# Patient Record
Sex: Female | Born: 1990 | Hispanic: Yes | Marital: Married | Smoking: Never smoker
Health system: Southern US, Community
[De-identification: ages and names within clinical notes are randomized; demographics above are authoritative.]

## PROBLEM LIST (undated history)

## (undated) ENCOUNTER — Inpatient Hospital Stay (HOSPITAL_COMMUNITY): Payer: Self-pay

## (undated) DIAGNOSIS — H9319 Tinnitus, unspecified ear: Secondary | ICD-10-CM

## (undated) DIAGNOSIS — J189 Pneumonia, unspecified organism: Secondary | ICD-10-CM

## (undated) DIAGNOSIS — E0590 Thyrotoxicosis, unspecified without thyrotoxic crisis or storm: Secondary | ICD-10-CM

## (undated) DIAGNOSIS — J45909 Unspecified asthma, uncomplicated: Secondary | ICD-10-CM

## (undated) DIAGNOSIS — E559 Vitamin D deficiency, unspecified: Secondary | ICD-10-CM

## (undated) DIAGNOSIS — R42 Dizziness and giddiness: Secondary | ICD-10-CM

## (undated) DIAGNOSIS — R011 Cardiac murmur, unspecified: Secondary | ICD-10-CM

## (undated) DIAGNOSIS — Z9889 Other specified postprocedural states: Secondary | ICD-10-CM

## (undated) DIAGNOSIS — R519 Headache, unspecified: Secondary | ICD-10-CM

## (undated) DIAGNOSIS — Z87440 Personal history of urinary (tract) infections: Secondary | ICD-10-CM

## (undated) DIAGNOSIS — R51 Headache: Secondary | ICD-10-CM

## (undated) DIAGNOSIS — E039 Hypothyroidism, unspecified: Secondary | ICD-10-CM

## (undated) DIAGNOSIS — R112 Nausea with vomiting, unspecified: Secondary | ICD-10-CM

## (undated) DIAGNOSIS — T7840XA Allergy, unspecified, initial encounter: Secondary | ICD-10-CM

## (undated) HISTORY — PX: HERNIA REPAIR: SHX51

## (undated) HISTORY — DX: Unspecified asthma, uncomplicated: J45.909

## (undated) HISTORY — DX: Dizziness and giddiness: R42

## (undated) HISTORY — DX: Allergy, unspecified, initial encounter: T78.40XA

## (undated) HISTORY — DX: Vitamin D deficiency, unspecified: E55.9

## (undated) HISTORY — DX: Headache: R51

## (undated) HISTORY — DX: Cardiac murmur, unspecified: R01.1

## (undated) HISTORY — DX: Headache, unspecified: R51.9

## (undated) HISTORY — DX: Tinnitus, unspecified ear: H93.19

---
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
## 2012-06-18 ENCOUNTER — Ambulatory Visit (INDEPENDENT_AMBULATORY_CARE_PROVIDER_SITE_OTHER): Payer: PRIVATE HEALTH INSURANCE | Admitting: Emergency Medicine

## 2012-06-18 VITALS — BP 98/70 | HR 76 | Temp 98.6°F | Resp 16 | Ht 65.0 in | Wt 114.6 lb

## 2012-06-18 DIAGNOSIS — N912 Amenorrhea, unspecified: Secondary | ICD-10-CM

## 2012-06-18 DIAGNOSIS — R42 Dizziness and giddiness: Secondary | ICD-10-CM

## 2012-06-18 DIAGNOSIS — R51 Headache: Secondary | ICD-10-CM

## 2012-06-18 LAB — POCT CBC
Granulocyte percent: 50.6 %G (ref 37–80)
HCT, POC: 44.2 % (ref 37.7–47.9)
Hemoglobin: 13.9 g/dL (ref 12.2–16.2)
Lymph, poc: 3.2 (ref 0.6–3.4)
MCH, POC: 29.8 pg (ref 27–31.2)
MCHC: 31.4 g/dL — AB (ref 31.8–35.4)
MCV: 94.7 fL (ref 80–97)
MID (cbc): 0.5 (ref 0–0.9)
MPV: 9.7 fL (ref 0–99.8)
POC Granulocyte: 3.7 (ref 2–6.9)
POC LYMPH PERCENT: 43.2 %L (ref 10–50)
POC MID %: 6.2 %M (ref 0–12)
Platelet Count, POC: 275 10*3/uL (ref 142–424)
RBC: 4.67 M/uL (ref 4.04–5.48)
RDW, POC: 13.9 %
WBC: 7.4 10*3/uL (ref 4.6–10.2)

## 2012-06-18 LAB — POCT INFLUENZA A/B
Influenza A, POC: NEGATIVE
Influenza B, POC: NEGATIVE

## 2012-06-18 LAB — POCT URINE PREGNANCY: Preg Test, Ur: NEGATIVE

## 2012-06-18 NOTE — Progress Notes (Signed)
  Subjective:    Patient ID: Ronnald Nian, female    DOB: 07-02-1991, 21 y.o.   MRN: [id number]  HPI patient enters with a chief complaint of for the last 3-4 days she has had a headache and some dizziness. She has a funny sensation in her ears. She has felt a little faint at times . She denies any true vertigo. She's had a headache but says it has not been severe. She works at [employer] and has been concerned number for coworkers have had the flu. She would like to be tested for the fluid to be sure she's not contagious at work    Review of Systems     Objective:   Physical Exam  Constitutional: She is oriented to person, place, and time. She appears well-developed and well-nourished.  HENT:  Head: Normocephalic.  Right Ear: External ear normal.  Left Ear: External ear normal.  Eyes: Pupils are equal, round, and reactive to light.  Neck: Normal range of motion. Neck supple.  Cardiovascular: Normal rate and regular rhythm.        There is a 2/6 systolic murmur present at the left sternal border.  Pulmonary/Chest: Effort normal and breath sounds normal. No respiratory distress. She has no wheezes. She has no rales.  Abdominal: She exhibits no distension. There is no tenderness. There is no rebound.  Musculoskeletal: Normal range of motion.  Neurological: She is alert and oriented to person, place, and time. She has normal reflexes. No cranial nerve deficit. She exhibits normal muscle tone. Coordination normal.      Results for orders placed in visit on 06/18/12  POCT CBC      Component Value Range   WBC 7.4  4.6 - 10.2 K/uL   Lymph, poc 3.2  0.6 - 3.4   POC LYMPH PERCENT 43.2  10 - 50 %L   MID (cbc) 0.5  0 - 0.9   POC MID % 6.2  0 - 12 %M   POC Granulocyte 3.7  2 - 6.9   Granulocyte percent 50.6  37 - 80 %G   RBC 4.67  4.04 - 5.48 M/uL   Hemoglobin 13.9  12.2 - 16.2 g/dL   HCT, POC 40.9  81.1 - 47.9 %   MCV 94.7  80 - 97 fL   MCH, POC 29.8  27 - 31.2 pg   MCHC 31.4 (*)  31.8 - 35.4 g/dL   RDW, POC 91.4     Platelet Count, POC 275  142 - 424 K/uL   MPV 9.7  0 - 99.8 fL  POCT INFLUENZA A/B      Component Value Range   Influenza A, POC Negative     Influenza B, POC Negative    POCT URINE PREGNANCY      Component Value Range   Preg Test, Ur Negative        Assessment & Plan:  I advised the patient it would be a good idea to have an echocardiogram at some time. She would prefer to have this done at [facility] and let me know what cardiologist she wants to see. I encouraged her to take Tylenol and drink large quantities of fluids and if she continues to have problems or she has worsening we will proceed with a head CT .

## 2012-06-18 NOTE — Progress Notes (Deleted)
  Subjective:    Patient ID: Janet Allen, female    DOB: [birth year], 21 y.o.   MRN: [id number]  HPI    Review of Systems     Objective:   Physical Exam        Assessment & Plan:

## 2012-06-19 LAB — COMPREHENSIVE METABOLIC PANEL
ALT: 10 U/L (ref 0–35)
AST: 15 U/L (ref 0–37)
Albumin: 5.3 g/dL — ABNORMAL HIGH (ref 3.5–5.2)
Alkaline Phosphatase: 73 U/L (ref 39–117)
BUN: 7 mg/dL (ref 6–23)
CO2: 26 mEq/L (ref 19–32)
Calcium: 10.2 mg/dL (ref 8.4–10.5)
Chloride: 104 mEq/L (ref 96–112)
Creat: 0.79 mg/dL (ref 0.50–1.10)
Glucose, Bld: 118 mg/dL — ABNORMAL HIGH (ref 70–99)
Potassium: 4.4 mEq/L (ref 3.5–5.3)
Sodium: 139 mEq/L (ref 135–145)
Total Bilirubin: 0.4 mg/dL (ref 0.3–1.2)
Total Protein: 8.4 g/dL — ABNORMAL HIGH (ref 6.0–8.3)

## 2012-06-19 LAB — TSH: TSH: 1.467 u[IU]/mL (ref 0.350–4.500)

## 2012-11-10 ENCOUNTER — Other Ambulatory Visit: Payer: Self-pay | Admitting: Family Medicine

## 2012-11-10 DIAGNOSIS — D249 Benign neoplasm of unspecified breast: Secondary | ICD-10-CM

## 2012-11-17 ENCOUNTER — Ambulatory Visit
Admission: RE | Admit: 2012-11-17 | Discharge: 2012-11-17 | Disposition: A | Discharge status: Home / Self Care | Payer: PRIVATE HEALTH INSURANCE | Source: Ambulatory Visit | Attending: Family Medicine | Admitting: Family Medicine

## 2012-11-17 DIAGNOSIS — D249 Benign neoplasm of unspecified breast: Secondary | ICD-10-CM

## 2012-11-30 ENCOUNTER — Ambulatory Visit (INDEPENDENT_AMBULATORY_CARE_PROVIDER_SITE_OTHER): Payer: PRIVATE HEALTH INSURANCE | Admitting: General Surgery

## 2012-12-01 ENCOUNTER — Ambulatory Visit (INDEPENDENT_AMBULATORY_CARE_PROVIDER_SITE_OTHER): Payer: PRIVATE HEALTH INSURANCE | Admitting: General Surgery

## 2012-12-01 ENCOUNTER — Encounter (INDEPENDENT_AMBULATORY_CARE_PROVIDER_SITE_OTHER): Payer: Self-pay | Admitting: General Surgery

## 2012-12-01 VITALS — BP 118/62 | HR 84 | Temp 98.4°F | Resp 18 | Ht 64.0 in | Wt <= 1120 oz

## 2012-12-01 DIAGNOSIS — N6320 Unspecified lump in the left breast, unspecified quadrant: Secondary | ICD-10-CM

## 2012-12-01 DIAGNOSIS — N63 Unspecified lump in unspecified breast: Secondary | ICD-10-CM

## 2012-12-01 NOTE — Progress Notes (Signed)
Patient ID: Janet Allen, female   DOB: 1991-04-23, 22 y.o.   MRN: [id number]  Chief Complaint  Patient presents with  . New Evaluation    breast    HPI Janet Allen is a 22 y.o. female.  HPI  This is a 22 year old female who palpated an abnormal spot in her breast within the last month. Her breast was extremely painful right before her period.  She saw her primary care physician who ordered a ultrasound of the breast. 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 The pain has subsided a little bit now that her period is over. She has never had masses in her breasts before. She did have a grandmother that had multiple benign breast problems.  Past medical history: Asthma  Past Surgical History  Procedure Date  . Hernia repair 1990  . Tympanostomy tube placement 1991  . Wisdom tooth extraction 2004    Family History  Problem Relation Age of Onset  . Hypertension Mother   . Depression Mother   . Anxiety disorder Sister   . Fibrocystic breast disease Maternal Grandmother   . Hypertension Paternal Grandmother   . Cancer Paternal Grandmother     stomach    Social History History  Substance Use Topics  . Smoking status: Never Smoker   . Smokeless tobacco: Not on file  . Alcohol Use: Not on file    No Known Allergies  Current Outpatient Prescriptions  Medication Sig Dispense Refill  . fluticasone-salmeterol (ADVAIR HFA) 115-21 MCG/ACT inhaler Inhale 2 puffs into the lungs 2 (two) times daily.      Marland Kitchen levocetirizine (XYZAL) 5 MG tablet Take 5 mg by mouth every evening.        Review of Systems Review of Systems  HENT: Positive for sore throat.   Respiratory: Positive for cough.     Blood pressure 118/62, pulse 84, temperature 98.4 F (36.9 C), resp. rate 18, height 5\' 4"  (1.626 m), weight 11 lb (4.99 kg).  Physical Exam Physical Exam  Constitutional: She is oriented to person, place, and time. She appears well-developed and  well-nourished. No distress.  HENT:  Head: Normocephalic and atraumatic.  Right Ear: External ear normal.  Eyes: Conjunctivae normal are normal. Pupils are equal, round, and reactive to light. Right eye exhibits no discharge. Left eye exhibits no discharge. No scleral icterus.  Neck: Normal range of motion. Neck supple. No tracheal deviation present. No thyromegaly present.  Cardiovascular: Normal rate and intact distal pulses.   Pulmonary/Chest: Effort normal. No respiratory distress. Left breast exhibits mass (x2).         Left breast mass 6 o'clock was around 2 cm, one at 2 o'clock 1.5 cm.    Abdominal: Soft. She exhibits no distension and no mass. There is no tenderness. There is no rebound and no guarding.  Musculoskeletal: Normal range of motion. She exhibits no tenderness.  Lymphadenopathy:    She has no cervical adenopathy.  Neurological: She is alert and oriented to person, place, and time. Coordination normal.  Skin: Skin is warm and dry. No rash noted. She is not diaphoretic. No erythema. No pallor.  Psychiatric: She has a normal mood and affect. Her behavior is normal. Judgment and thought content normal.    Data Reviewed Ultrasound removed  Assessment/Plan    Mass of multiple sites of left breast Patient is a 22 year old female with 2 masses in the left breast. One of them is at 2:00 and one  is at 6:00. 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 Because of the symptoms and need for pathologic diagnosis, we will plan excision.  The surgery was reviewed with the patient. I advised her that she would need to do no heavy lifting for around 4-7 days.  I reviewed the risk of surgery including bleeding, infection, damage to adjacent structures. I reviewed the risk of seroma. Educational materials given to the patient. She is a Engineer, civil (consulting) at [employer].  She is going to call surgery scheduling when she is looking at her schedule and can arrange time to have adequate recovery  period.     Wm Fruchter 12/01/2012, 9:38 AM

## 2012-12-01 NOTE — Patient Instructions (Signed)
IF YOU ARE TAKING ASPIRIN, COUMADIN/WARFARIN, PLAVIX, OR OTHER BLOOD THINNER, PLEASE LET us KNOW IMMEDIATELY.  WE WILL NEED TO DISCUSS WITH THE PRESCRIBING PROVIDER IF THESE ARE SAFE TO STOP. IF THESE ARE NOT STOPPED AT THE APPROPRIATE TIME, THIS WILL RESULT IN A DELAY FOR YOUR SURGERY.  DO NOT TAKE THESE MEDICATIONS OR IBUPROFEN/NAPROXEN WITHIN A WEEK BEFORE SURGERY.   The main risks of surgery are bleeding, infection, damage to other structures, and seroma (accumulation of fluid) under the incision site(s).    These complications may lead to additional procedures such as drainage of seroma/infection.  Most women do accumulate fluid in the breast cavity where the specimen was removed. We do not always have to drain this fluid.  If your breast is very tense, painful, or red, then we may need to numb the skin and use a needle to aspirate the fluid.  We do provide patients with a Breast Binder.  The purpose of this is to avoid the use of tape on the sensitive tissue of the breast and to provide some compression to minimize the risk of seroma.  If the binder is uncomfortable, you may find that a tank top with a built-in shelf bra or a loose sports bra works better for you.  I recommend wearing this around the clock for the first 1-2 weeks except in the shower.    You may remove your dressings and may shower 48 hours after surgery .  Many patients have some constipation in the week after surgery due to the narcotics and anesthesia.  You may need over the counter stool softeners or laxatives if you experience difficulty having bowel movements.    If the following occur, call our office at [phone]: If you have a fever over 101 or pain that is severe despite narcotics. If you have redness or drainage at the wound. If you develop persistent nausea or vomiting.  I will follow you back up in 1-4 weeks.    Please submit any paperwork about time off work/insurance forms to the front  desk.

## 2012-12-01 NOTE — Assessment & Plan Note (Signed)
Patient is a 23 year old female with 2 masses in the left breast. One of them is at 2:00 and one is at 6:00. 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 Because of the symptoms and need for pathologic diagnosis, we will plan excision.  The surgery was reviewed with the patient. I advised her that she would need to do no heavy lifting for around 4-7 days.  I reviewed the risk of surgery including bleeding, infection, damage to adjacent structures. I reviewed the risk of seroma. Educational materials given to the patient. 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

## 2012-12-10 ENCOUNTER — Ambulatory Visit (INDEPENDENT_AMBULATORY_CARE_PROVIDER_SITE_OTHER): Payer: PRIVATE HEALTH INSURANCE | Admitting: Internal Medicine

## 2012-12-10 VITALS — BP 134/86 | HR 112 | Temp 98.4°F | Resp 16 | Ht 65.0 in | Wt 108.0 lb

## 2012-12-10 DIAGNOSIS — J029 Acute pharyngitis, unspecified: Secondary | ICD-10-CM

## 2012-12-10 DIAGNOSIS — J309 Allergic rhinitis, unspecified: Secondary | ICD-10-CM

## 2012-12-10 DIAGNOSIS — R05 Cough: Secondary | ICD-10-CM

## 2012-12-10 DIAGNOSIS — R5383 Other fatigue: Secondary | ICD-10-CM

## 2012-12-10 DIAGNOSIS — J45909 Unspecified asthma, uncomplicated: Secondary | ICD-10-CM | POA: Insufficient documentation

## 2012-12-10 LAB — POCT CBC
Lymph, poc: 2.1 (ref 0.6–3.4)
MCH, POC: 30.2 pg (ref 27–31.2)
MCHC: 32.2 g/dL (ref 31.8–35.4)
MCV: 93.7 fL (ref 80–97)
MID (cbc): 0.7 (ref 0–0.9)
POC LYMPH PERCENT: 23.1 %L (ref 10–50)
Platelet Count, POC: 317 10*3/uL (ref 142–424)
RBC: 4.87 M/uL (ref 4.04–5.48)
WBC: 9.3 10*3/uL (ref 4.6–10.2)

## 2012-12-10 MED ORDER — LIDOCAINE VISCOUS 2 % MT SOLN: OROMUCOSAL | Status: DC

## 2012-12-11 NOTE — Progress Notes (Signed)
  Subjective:    Patient ID: Janet Allen, female    DOB: Jun 04, 1991, 22 y.o.   MRN: [id number]  HPI has been ill for the last 3 weeks Started with a flulike illness/this led to cough with wheezing She was treated with Zithromax prednisone and nebulization by her allergist(im Steroids as well)- This followed a visit at [facility] where diagnosis was viral Has been seen again posttreatment day 5 without improvement Now a day and since Zithromax in complaining of worsening of sore throat for 3 days, fatigue, dysphagia, coughing at night nonproductive.  History of extrinsic asthma and allergic rhinitis Recent workup for tender breast mass thought to be fibroadenoma by ultrasound   Employed as nurse at [employer]    Review of Systems No fever chills night sweats No weight loss No chest pain or palpitations No heat or cold intolerance No recent appetite change except for poor intake for the last 2 weeks No episodic genitourinary problems No change in menses    Objective   Physical Exam Vital signs within normal limits No acute distress TMs clear Nares clear Throat injected with vesicles on the posterior pharynx No a.c. p.c. or [place] nodes Chest clear Heart regular without murmur Abdomen benign Extremities clear No rash  Results for orders placed in visit on 12/10/12  POCT CBC      Result Value Range   WBC 9.3  4.6 - 10.2 K/uL   Lymph, poc 2.1  0.6 - 3.4   POC LYMPH PERCENT 23.1  10 - 50 %L   MID (cbc) 0.7  0 - 0.9   POC MID % 7.9  0 - 12 %M   POC Granulocyte 6.4  2 - 6.9   Granulocyte percent 69.0  37 - 80 %G   RBC 4.87  4.04 - 5.48 M/uL   Hemoglobin 14.7  12.2 - 16.2 g/dL   HCT, POC 08.6  57.8 - 47.9 %   MCV 93.7  80 - 97 fL   MCH, POC 30.2  27 - 31.2 pg   MCHC 32.2  31.8 - 35.4 g/dL   RDW, POC 46.9     Platelet Count, POC 317  142 - 424 K/uL   MPV 9.7  0 - 99.8 fL  POCT RAPID STREP A (OFFICE)      Result Value Range   Rapid Strep A Screen  Negative  Negative        Assessment & Plan:  Problem #1 viral pharyngitis following an initial viral infection that developed into reactive airway disease. 1. Other malaise and fatigue  POCT CBC   POCT CBC   TSH  2. Cough    3. Acute pharyngitis  POCT rapid strep A   POCT rapid strep A   Culture, Group A Strep  4. Extrinsic asthma, unspecified    5. Allergic rhinitis       Treat Symptoms and expect recovery within 1 week or recheck Tc sent

## 2012-12-13 LAB — CULTURE, GROUP A STREP: Organism ID, Bacteria: NORMAL

## 2012-12-16 ENCOUNTER — Other Ambulatory Visit: Payer: Self-pay

## 2013-03-12 ENCOUNTER — Encounter (INDEPENDENT_AMBULATORY_CARE_PROVIDER_SITE_OTHER): Payer: Self-pay | Admitting: General Surgery

## 2013-03-12 ENCOUNTER — Ambulatory Visit (INDEPENDENT_AMBULATORY_CARE_PROVIDER_SITE_OTHER): Payer: PRIVATE HEALTH INSURANCE | Admitting: General Surgery

## 2013-03-12 ENCOUNTER — Encounter (HOSPITAL_COMMUNITY): Payer: Self-pay | Admitting: Pharmacy Technician

## 2013-03-12 VITALS — BP 132/82 | HR 78 | Temp 97.4°F | Resp 16 | Ht 64.0 in | Wt 112.9 lb

## 2013-03-12 DIAGNOSIS — N6320 Unspecified lump in the left breast, unspecified quadrant: Secondary | ICD-10-CM

## 2013-03-12 DIAGNOSIS — N63 Unspecified lump in unspecified breast: Secondary | ICD-10-CM

## 2013-03-12 NOTE — Assessment & Plan Note (Signed)
Patient was unable previously to get surgery scheduled due to job constraints.  I will to excise both of these masses under general anesthesia. I advised the patient that I would make 2 incisions in order to minimize the trauma to her skin and underlying breast.  She is advised of the risk of bleeding and infection.  This is an outpatient procedure. She was advised that she will need someone with her at home overnight.  I also discussed lifting restrictions with her.

## 2013-03-12 NOTE — Progress Notes (Signed)
HISTORY: Patient is a 22-year-old female with 2 breast masses in the left breast. 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 She was unable to schedule surgery in January because of her job.  She feels that the mass it at 2:00 has gotten much more painful and larger.  The breast pain during her periods is especially bad.  She is ready now to have them excised.     PERTINENT REVIEW OF SYSTEMS: otherwise negative.    Filed Vitals:   03/12/13 1414  BP: 132/82  Pulse: 78  Temp: 97.4 F (36.3 C)  Resp: 16    EXAM: Head: Normocephalic and atraumatic.  Eyes:  Conjunctivae are normal. Pupils are equal, round, and reactive to light. No scleral icterus.  Breast:  Left upper outer quadrant mass now around 3 cm, 6 o'clock mass 1.5x1 cm Resp: No respiratory distress, normal effort. Neurological: Alert and oriented to person, place, and time. Coordination normal.  Skin: Skin is warm and dry. No rash noted. No diaphoretic. No erythema. No pallor.  Psychiatric: Normal mood and affect. Normal behavior. Judgment and thought content normal.      ASSESSMENT AND PLAN:   Mass of multiple sites of left breast Patient was unable previously to get surgery scheduled due to job constraints.  I will to excise both of these masses under general anesthesia. I advised the patient that I would make 2 incisions in order to minimize the trauma to her skin and underlying breast.  She is advised of the risk of bleeding and infection.  This is an outpatient procedure. She was advised that she will need someone with her at home overnight.  I also discussed lifting restrictions with her.      Iana Buzan L Lynnetta Tom, MD Surgical Oncology, General & Endocrine Surgery [facility]  WOLTERS,SHARON A, MD Wolters, Sharon A, MD   

## 2013-03-12 NOTE — Patient Instructions (Signed)
IF YOU ARE TAKING ASPIRIN, COUMADIN/WARFARIN, PLAVIX, OR OTHER BLOOD THINNER, PLEASE LET US KNOW IMMEDIATELY.  WE WILL NEED TO DISCUSS WITH THE PRESCRIBING PROVIDER IF THESE ARE SAFE TO STOP. IF THESE ARE NOT STOPPED AT THE APPROPRIATE TIME, THIS WILL RESULT IN A DELAY FOR YOUR SURGERY.  DO NOT TAKE THESE MEDICATIONS OR IBUPROFEN/NAPROXEN WITHIN A WEEK BEFORE SURGERY.   The main risks of surgery are bleeding, infection, damage to other structures, and seroma (accumulation of fluid) under the incision site(s).    These complications may lead to additional procedures such as drainage of seroma/infection.  If cancer is found, you may need other surgeries to obtain negative margins or to take more lymph nodes.   Most women do accumulate fluid in the breast cavity where the specimen was removed. We do not always have to drain this fluid.  If your breast is very tense, painful, or red, then we may need to numb the skin and use a needle to aspirate the fluid.  We do provide patients with a Breast Binder.  The purpose of this is to avoid the use of tape on the sensitive tissue of the breast and to provide some compression to minimize the risk of seroma.  If the binder is uncomfortable, you may find that a tank top with a built-in shelf bra or a loose sports bra works better for you.  I recommend wearing this around the clock for the first 1-2 weeks except in the shower.    You may remove your dressings and may shower 48 hours after surgery IF YOU DO NOT HAVE A DRAIN.    Many patients have some constipation in the week after surgery due to the narcotics and anesthesia.  You may need over the counter stool softeners or laxatives if you experience difficulty having bowel movements.    If the following occur, call our office at [phone]: If you have a fever over 101 or pain that is severe despite narcotics. If you have redness or drainage at the wound. If you develop persistent nausea or vomiting.  I  will follow you back up in 1-4 weeks.    Please submit any paperwork about time off work/insurance forms to the front desk.   

## 2013-03-14 ENCOUNTER — Encounter (HOSPITAL_COMMUNITY): Payer: Self-pay

## 2013-03-14 ENCOUNTER — Encounter (HOSPITAL_COMMUNITY)
Admission: RE | Admit: 2013-03-14 | Discharge: 2013-03-14 | Disposition: A | Discharge status: Home / Self Care | Payer: PRIVATE HEALTH INSURANCE | Source: Ambulatory Visit | Attending: General Surgery | Admitting: General Surgery

## 2013-03-14 HISTORY — DX: Personal history of urinary (tract) infections: Z87.440

## 2013-03-14 HISTORY — DX: Pneumonia, unspecified organism: J18.9

## 2013-03-14 LAB — CBC
MCH: 30.7 pg (ref 26.0–34.0)
Platelets: 226 10*3/uL (ref 150–400)
RBC: 4.17 MIL/uL (ref 3.87–5.11)
RDW: 12.9 % (ref 11.5–15.5)
WBC: 4.6 10*3/uL (ref 4.0–10.5)

## 2013-03-14 LAB — SURGICAL PCR SCREEN: MRSA, PCR: NEGATIVE

## 2013-03-14 MED ORDER — CEFAZOLIN SODIUM-DEXTROSE 2-3 GM-% IV SOLR
2.0000 g | INTRAVENOUS | Status: AC
  Administered 2013-03-15: 2 g via INTRAVENOUS
  Filled 2013-03-14: qty 50

## 2013-03-14 NOTE — Pre-Procedure Instructions (Addendum)
ELYSIA GRAND  03/14/2013   Your procedure is scheduled on:  03/15/13  Report to [facility] at 1230 AM.  Call this number if you have problems the morning of surgery: [phone]   Remember:   Do not eat food or drink liquids after midnight.   Take these medicines the morning of surgery with A SIP OF WATER: inhalers   Do not wear jewelry, make-up or nail polish.  Do not wear lotions, powders, or perfumes. You may wear deodorant.  Do not shave 48 hours prior to surgery. Men may shave face and neck.  Do not bring valuables to the hospital.  Contacts, dentures or bridgework may not be worn into surgery.  Leave suitcase in the car. After surgery it may be brought to your room.  For patients admitted to the hospital, checkout time is 11:00 AM the day of  discharge.   Patients discharged the day of surgery will not be allowed to drive  home.  Name and phone number of your driver:   Special Instructions: Shower using CHG 2 nights before surgery and the night before surgery.  If you shower the day of surgery use CHG.  Use special wash - you have one bottle of CHG for all showers.  You should use approximately 1/3 of the bottle for each shower.   Please read over the following fact sheets that you were given: Pain Booklet, Coughing and Deep Breathing, MRSA Information and Surgical Site Infection Prevention

## 2013-03-15 ENCOUNTER — Encounter (HOSPITAL_COMMUNITY): Payer: Self-pay | Admitting: *Deleted

## 2013-03-15 ENCOUNTER — Encounter (HOSPITAL_COMMUNITY)
Admission: RE | Disposition: A | Discharge status: Home / Self Care | Payer: Self-pay | Source: Ambulatory Visit | Attending: General Surgery

## 2013-03-15 ENCOUNTER — Encounter (HOSPITAL_COMMUNITY): Payer: Self-pay | Admitting: Anesthesiology

## 2013-03-15 ENCOUNTER — Ambulatory Visit (HOSPITAL_COMMUNITY): Payer: PRIVATE HEALTH INSURANCE | Admitting: Anesthesiology

## 2013-03-15 ENCOUNTER — Ambulatory Visit (HOSPITAL_COMMUNITY)
Admission: RE | Admit: 2013-03-15 | Discharge: 2013-03-15 | Disposition: A | Discharge status: Home / Self Care | Payer: PRIVATE HEALTH INSURANCE | Source: Ambulatory Visit | Attending: General Surgery | Admitting: General Surgery

## 2013-03-15 DIAGNOSIS — Z8701 Personal history of pneumonia (recurrent): Secondary | ICD-10-CM | POA: Insufficient documentation

## 2013-03-15 DIAGNOSIS — N63 Unspecified lump in unspecified breast: Secondary | ICD-10-CM | POA: Insufficient documentation

## 2013-03-15 DIAGNOSIS — J45909 Unspecified asthma, uncomplicated: Secondary | ICD-10-CM | POA: Insufficient documentation

## 2013-03-15 DIAGNOSIS — R011 Cardiac murmur, unspecified: Secondary | ICD-10-CM | POA: Insufficient documentation

## 2013-03-15 DIAGNOSIS — D249 Benign neoplasm of unspecified breast: Secondary | ICD-10-CM

## 2013-03-15 HISTORY — PX: MASS EXCISION: SHX2000

## 2013-03-15 SURGERY — EXCISION MASS
Anesthesia: General | Site: Breast | Laterality: Left | Wound class: Clean

## 2013-03-15 MED ORDER — LIDOCAINE HCL (PF) 1 % IJ SOLN
INTRAMUSCULAR | Status: AC
  Filled 2013-03-15: qty 30

## 2013-03-15 MED ORDER — OXYCODONE HCL 5 MG PO TABS
5.0000 mg | ORAL_TABLET | ORAL | Status: DC | PRN
  Administered 2013-03-15: 5 mg via ORAL

## 2013-03-15 MED ORDER — LACTATED RINGERS IV SOLN
INTRAVENOUS | Status: DC
  Administered 2013-03-15: 14:00:00 via INTRAVENOUS

## 2013-03-15 MED ORDER — OXYCODONE HCL 5 MG PO TABS
ORAL_TABLET | ORAL | Status: AC
  Filled 2013-03-15: qty 1

## 2013-03-15 MED ORDER — 0.9 % SODIUM CHLORIDE (POUR BTL) OPTIME
TOPICAL | Status: DC | PRN
  Administered 2013-03-15: 1000 mL

## 2013-03-15 MED ORDER — LIDOCAINE HCL (CARDIAC) 20 MG/ML IV SOLN
INTRAVENOUS | Status: DC | PRN
  Administered 2013-03-15: 50 mg via INTRAVENOUS

## 2013-03-15 MED ORDER — BUPIVACAINE-EPINEPHRINE PF 0.25-1:200000 % IJ SOLN: INTRAMUSCULAR | Status: DC | PRN

## 2013-03-15 MED ORDER — MIDAZOLAM HCL 5 MG/5ML IJ SOLN
INTRAMUSCULAR | Status: DC | PRN
  Administered 2013-03-15: 2 mg via INTRAVENOUS

## 2013-03-15 MED ORDER — PROPOFOL 10 MG/ML IV BOLUS
INTRAVENOUS | Status: DC | PRN
  Administered 2013-03-15: 200 mg via INTRAVENOUS

## 2013-03-15 MED ORDER — LACTATED RINGERS IV SOLN
INTRAVENOUS | Status: DC | PRN
  Administered 2013-03-15 (×2): via INTRAVENOUS

## 2013-03-15 MED ORDER — KETOROLAC TROMETHAMINE 30 MG/ML IJ SOLN
INTRAMUSCULAR | Status: DC | PRN
  Administered 2013-03-15: 30 mg via INTRAVENOUS

## 2013-03-15 MED ORDER — BUPIVACAINE HCL (PF) 0.25 % IJ SOLN
INTRAMUSCULAR | Status: AC
  Filled 2013-03-15: qty 10

## 2013-03-15 MED ORDER — ONDANSETRON HCL 4 MG/2ML IJ SOLN
INTRAMUSCULAR | Status: DC | PRN
  Administered 2013-03-15: 4 mg via INTRAVENOUS

## 2013-03-15 MED ORDER — BUPIVACAINE-EPINEPHRINE PF 0.25-1:200000 % IJ SOLN
INTRAMUSCULAR | Status: AC
  Filled 2013-03-15: qty 30

## 2013-03-15 MED ORDER — BUPIVACAINE-EPINEPHRINE PF 0.25-1:200000 % IJ SOLN
INTRAMUSCULAR | Status: DC | PRN
  Administered 2013-03-15: 15:00:00

## 2013-03-15 MED ORDER — FENTANYL CITRATE 0.05 MG/ML IJ SOLN
INTRAMUSCULAR | Status: DC | PRN
  Administered 2013-03-15 (×3): 25 ug via INTRAVENOUS
  Administered 2013-03-15: 50 ug via INTRAVENOUS

## 2013-03-15 MED ORDER — OXYCODONE-ACETAMINOPHEN 5-325 MG PO TABS: 1.0000 | ORAL_TABLET | ORAL | Status: DC | PRN

## 2013-03-15 SURGICAL SUPPLY — 55 items
ADH SKN CLS APL DERMABOND .7 (GAUZE/BANDAGES/DRESSINGS)
APL SKNCLS STERI-STRIP NONHPOA (GAUZE/BANDAGES/DRESSINGS) ×1
BENZOIN TINCTURE PRP APPL 2/3 (GAUZE/BANDAGES/DRESSINGS) ×2 IMPLANT
BINDER BREAST MEDIUM (GAUZE/BANDAGES/DRESSINGS) ×1 IMPLANT
BLADE SURG 10 STRL SS (BLADE) ×2 IMPLANT
BLADE SURG 15 STRL LF DISP TIS (BLADE) ×1 IMPLANT
BLADE SURG 15 STRL SS (BLADE) ×2
CANISTER SUCTION 2500CC (MISCELLANEOUS) ×2 IMPLANT
CHLORAPREP W/TINT 26ML (MISCELLANEOUS) ×2 IMPLANT
CLOTH BEACON ORANGE TIMEOUT ST (SAFETY) ×2 IMPLANT
COVER SURGICAL LIGHT HANDLE (MISCELLANEOUS) ×2 IMPLANT
DERMABOND ADVANCED (GAUZE/BANDAGES/DRESSINGS)
DERMABOND ADVANCED .7 DNX12 (GAUZE/BANDAGES/DRESSINGS) IMPLANT
DRAPE LAPAROSCOPIC ABDOMINAL (DRAPES) IMPLANT
DRAPE PED LAPAROTOMY (DRAPES) IMPLANT
DRAPE UTILITY 15X26 W/TAPE STR (DRAPE) ×4 IMPLANT
DRSG PAD ABDOMINAL 8X10 ST (GAUZE/BANDAGES/DRESSINGS) ×1 IMPLANT
DRSG TEGADERM 4X4.75 (GAUZE/BANDAGES/DRESSINGS) IMPLANT
ELECT CAUTERY BLADE 6.4 (BLADE) ×2 IMPLANT
ELECT REM PT RETURN 9FT ADLT (ELECTROSURGICAL) ×2
ELECTRODE REM PT RTRN 9FT ADLT (ELECTROSURGICAL) ×1 IMPLANT
GAUZE SPONGE 4X4 16PLY XRAY LF (GAUZE/BANDAGES/DRESSINGS) ×2 IMPLANT
GLOVE BIO SURGEON STRL SZ 6 (GLOVE) ×2 IMPLANT
GLOVE BIOGEL PI IND STRL 6.5 (GLOVE) ×1 IMPLANT
GLOVE BIOGEL PI IND STRL 7.0 (GLOVE) IMPLANT
GLOVE BIOGEL PI INDICATOR 6.5 (GLOVE) ×1
GLOVE BIOGEL PI INDICATOR 7.0 (GLOVE) ×1
GLOVE ECLIPSE 8.0 STRL XLNG CF (GLOVE) ×1 IMPLANT
GLOVE SURG SS PI 7.0 STRL IVOR (GLOVE) ×1 IMPLANT
GOWN PREVENTION PLUS XXLARGE (GOWN DISPOSABLE) ×2 IMPLANT
GOWN STRL NON-REIN LRG LVL3 (GOWN DISPOSABLE) ×2 IMPLANT
KIT BASIN OR (CUSTOM PROCEDURE TRAY) ×2 IMPLANT
KIT ROOM TURNOVER OR (KITS) ×2 IMPLANT
NDL HYPO 25GX1X1/2 BEV (NEEDLE) ×1 IMPLANT
NEEDLE HYPO 25GX1X1/2 BEV (NEEDLE) ×2 IMPLANT
NS IRRIG 1000ML POUR BTL (IV SOLUTION) ×2 IMPLANT
PACK SURGICAL SETUP 50X90 (CUSTOM PROCEDURE TRAY) ×2 IMPLANT
PAD ARMBOARD 7.5X6 YLW CONV (MISCELLANEOUS) ×4 IMPLANT
PEN SKIN MARKING BROAD (MISCELLANEOUS) ×1 IMPLANT
PENCIL BUTTON HOLSTER BLD 10FT (ELECTRODE) ×2 IMPLANT
SPECIMEN JAR SMALL (MISCELLANEOUS) ×2 IMPLANT
SPONGE GAUZE 4X4 12PLY (GAUZE/BANDAGES/DRESSINGS) ×1 IMPLANT
STRIP CLOSURE SKIN 1/2X4 (GAUZE/BANDAGES/DRESSINGS) ×1 IMPLANT
SUT MON AB 4-0 PC3 18 (SUTURE) ×3 IMPLANT
SUT SILK 2 0 FS (SUTURE) IMPLANT
SUT VIC AB 3-0 SH 27 (SUTURE) ×4
SUT VIC AB 3-0 SH 27X BRD (SUTURE) ×1 IMPLANT
SYR BULB 3OZ (MISCELLANEOUS) ×2 IMPLANT
SYR CONTROL 10ML LL (SYRINGE) ×2 IMPLANT
TAPE CLOTH SURG 4X10 WHT LF (GAUZE/BANDAGES/DRESSINGS) ×1 IMPLANT
TOWEL OR 17X24 6PK STRL BLUE (TOWEL DISPOSABLE) ×2 IMPLANT
TOWEL OR 17X26 10 PK STRL BLUE (TOWEL DISPOSABLE) ×2 IMPLANT
TUBE CONNECTING 12X1/4 (SUCTIONS) IMPLANT
WATER STERILE IRR 1000ML POUR (IV SOLUTION) IMPLANT
YANKAUER SUCT BULB TIP NO VENT (SUCTIONS) IMPLANT

## 2013-03-15 NOTE — Anesthesia Procedure Notes (Signed)
Procedure Name: LMA Insertion Date/Time: 03/15/2013 1:58 PM Performed by: Elizbeth Squires R Pre-anesthesia Checklist: Patient identified, Emergency Drugs available, Suction available and Patient being monitored Patient Re-evaluated:Patient Re-evaluated prior to inductionOxygen Delivery Method: Circle system utilized Preoxygenation: Pre-oxygenation with 100% oxygen Intubation Type: IV induction LMA: LMA inserted LMA Size: 3.0 Number of attempts: 1 Placement Confirmation: positive ETCO2 and breath sounds checked- equal and bilateral Tube secured with: Tape Dental Injury: Teeth and Oropharynx as per pre-operative assessment

## 2013-03-15 NOTE — Interval H&P Note (Signed)
History and Physical Interval Note:  03/15/2013 1:25 PM  Janet Allen  has presented today for surgery, with the diagnosis of breast mass  The various methods of treatment have been discussed with the patient and family. After consideration of risks, benefits and other options for treatment, the patient has consented to  Procedure(s) with comments: EXCISION OF TWO LEFT BREAST MASS (Left) - ANESTHESIA: CHOICE OR GENERAL as a surgical intervention .  The patient's history has been reviewed, patient examined, no change in status, stable for surgery.  I have reviewed the patient's chart and labs.  Questions were answered to the patient's satisfaction.     Kebin Maye

## 2013-03-15 NOTE — Op Note (Signed)
Excisional Breast Biopsy  Indications: This patient presents with history of left breast mass x2, probable fibroadenoma, enlarging and symptomatic  Pre-operative Diagnosis: left breast mass x2  Post-operative Diagnosis: left breast mass x2  Surgeon: Almond Lint   Anesthesia: General LMA anesthesia and Local anesthesia 1% plain lidocaine, 0.25.% bupivacaine, with epinephrine  ASA Class: 2  Procedure Details  The patient was seen in the Holding Room. The risks, benefits, complications, treatment options, and expected outcomes were discussed with the patient. The possibilities of reaction to medication, pulmonary aspiration, bleeding, infection, the need for additional procedures, failure to diagnose a condition, and creating a complication requiring transfusion or operation were discussed with the patient. The patient concurred with the proposed plan, giving informed consent.  The site of surgery properly noted/marked. The patient was taken to Operating Room # 1, identified, and the procedure verified as Breast Excisional Biopsy. A Time Out was held and the above information confirmed.  After induction of anesthesia, the left  breast and chest were prepped and draped in standard fashion. The lumpectomy was performed by creating a transverse incision over the upper outer quadrant of the breast.  Dissection was carried down to the pectoralis fascia while the mass was elevated with an allis clamp. This mass was less discrete with denser breast tissue at the borders.   Orientation sutures were placed.  Hemostasis was achieved with cautery.  The wound was irrigated and closed with a 3-0 Vicryl interrupted deep dermal stitch and a 4-0 Monocryl subcuticular closure in layers.  The second lumpectomy was performed by creating a vertical incision over the lower midline portion of the breast.  Dissection was carried down to the pectoralis fascia while the mass was elevated with an Allis clamp.  Orientation  sutures were placed.  Hemostasis was achieved with cautery.  The wound was irrigated and closed with a 3-0 Vicryl interrupted deep dermal stitch and a 4-0 Monocryl subcuticular closure in layers.     Sterile dressings were applied. At the end of the operation, all sponge, instrument, and needle counts were correct.  Findings: grossly clear surgical margins  Estimated Blood Loss:  Minimal                Specimens: Left breast mass 2 o'clock, Left breast mass 6 o'clock         Complications:  None; patient tolerated the procedure well.         Disposition: PACU - hemodynamically stable.         Condition: stable

## 2013-03-15 NOTE — Anesthesia Preprocedure Evaluation (Addendum)
Anesthesia Evaluation  Patient identified by MRN, date of birth, ID band Patient awake    Reviewed: Allergy & Precautions, H&P , NPO status , Patient's Chart, lab work & pertinent test results  Airway Mallampati: I TM Distance: >3 FB Neck ROM: Full    Dental  (+) Dental Advisory Given and Teeth Intact   Pulmonary asthma , pneumonia -, resolved,  breath sounds clear to auscultation        Cardiovascular + Valvular Problems/Murmurs Rhythm:Regular Rate:Normal     Neuro/Psych    GI/Hepatic   Endo/Other    Renal/GU      Musculoskeletal   Abdominal   Peds  Hematology   Anesthesia Other Findings   Reproductive/Obstetrics                          Anesthesia Physical Anesthesia Plan  ASA: II  Anesthesia Plan: General   Post-op Pain Management:    Induction: Intravenous  Airway Management Planned: LMA  Additional Equipment:   Intra-op Plan:   Post-operative Plan: Extubation in OR  Informed Consent: I have reviewed the patients History and Physical, chart, labs and discussed the procedure including the risks, benefits and alternatives for the proposed anesthesia with the patient or authorized representative who has indicated his/her understanding and acceptance.     Plan Discussed with: CRNA and Surgeon  Anesthesia Plan Comments:         Anesthesia Quick Evaluation

## 2013-03-15 NOTE — H&P (View-Only) (Signed)
HISTORY: Patient is a 22 year old female with 2 breast masses in the left breast. 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 She was unable to schedule surgery in January because of her job.  She feels that the mass it at 2:00 has gotten much more painful and larger.  The breast pain during her periods is especially bad.  She is ready now to have them excised.     PERTINENT REVIEW OF SYSTEMS: otherwise negative.    Filed Vitals:   03/12/13 1414  BP: 132/82  Pulse: 78  Temp: 97.4 F (36.3 C)  Resp: 16    EXAM: Head: Normocephalic and atraumatic.  Eyes:  Conjunctivae are normal. Pupils are equal, round, and reactive to light. No scleral icterus.  Breast:  Left upper outer quadrant mass now around 3 cm, 6 o'clock mass 1.5x1 cm Resp: No respiratory distress, normal effort. Neurological: Alert and oriented to person, place, and time. Coordination normal.  Skin: Skin is warm and dry. No rash noted. No diaphoretic. No erythema. No pallor.  Psychiatric: Normal mood and affect. Normal behavior. Judgment and thought content normal.      ASSESSMENT AND PLAN:   Mass of multiple sites of left breast Patient was unable previously to get surgery scheduled due to job constraints.  I will to excise both of these masses under general anesthesia. I advised the patient that I would make 2 incisions in order to minimize the trauma to her skin and underlying breast.  She is advised of the risk of bleeding and infection.  This is an outpatient procedure. She was advised that she will need someone with her at home overnight.  I also discussed lifting restrictions with her.      Maudry Diego, MD Surgical Oncology, General & Endocrine Surgery [facility]  Emeterio Reeve, MD Emeterio Reeve, MD

## 2013-03-15 NOTE — Transfer of Care (Signed)
Immediate Anesthesia Transfer of Care Note  Patient: Janet Allen  Procedure(s) Performed: Procedure(s): EXCISION OF TWO LEFT BREAST MASSES (Left)  Patient Location: PACU  Anesthesia Type:General  Level of Consciousness: responds to stimulation  Airway & Oxygen Therapy: Patient Spontanous Breathing and Patient connected to nasal cannula oxygen  Post-op Assessment: Report given to PACU RN and Post -op Vital signs reviewed and stable  Post vital signs: Reviewed and stable  Complications: No apparent anesthesia complications

## 2013-03-15 NOTE — Anesthesia Postprocedure Evaluation (Signed)
  Anesthesia Post-op Note  Patient: Janet Allen  Procedure(s) Performed: Procedure(s): EXCISION OF TWO LEFT BREAST MASSES (Left)  Patient Location: PACU  Anesthesia Type:General  Level of Consciousness: awake and alert   Airway and Oxygen Therapy: Patient Spontanous Breathing  Post-op Pain: mild  Post-op Assessment: Post-op Vital signs reviewed, Patient's Cardiovascular Status Stable, Respiratory Function Stable, Patent Airway, No signs of Nausea or vomiting and Pain level controlled  Post-op Vital Signs: stable  Complications: No apparent anesthesia complications

## 2013-03-16 ENCOUNTER — Encounter (HOSPITAL_COMMUNITY): Payer: Self-pay | Admitting: General Surgery

## 2013-03-19 ENCOUNTER — Telehealth (INDEPENDENT_AMBULATORY_CARE_PROVIDER_SITE_OTHER): Payer: Self-pay

## 2013-03-19 NOTE — Telephone Encounter (Signed)
Pt notified pathology benign.

## 2013-03-19 NOTE — Progress Notes (Signed)
Quick Note:  Please let pt know that path is benign. ______

## 2013-03-29 ENCOUNTER — Encounter (INDEPENDENT_AMBULATORY_CARE_PROVIDER_SITE_OTHER): Payer: PRIVATE HEALTH INSURANCE | Admitting: General Surgery

## 2013-04-02 ENCOUNTER — Ambulatory Visit (INDEPENDENT_AMBULATORY_CARE_PROVIDER_SITE_OTHER): Payer: PRIVATE HEALTH INSURANCE | Admitting: General Surgery

## 2013-04-02 ENCOUNTER — Encounter (INDEPENDENT_AMBULATORY_CARE_PROVIDER_SITE_OTHER): Payer: Self-pay | Admitting: General Surgery

## 2013-04-02 VITALS — BP 118/72 | HR 81 | Temp 98.8°F | Resp 17 | Ht 64.0 in | Wt 112.0 lb

## 2013-04-02 DIAGNOSIS — N63 Unspecified lump in unspecified breast: Secondary | ICD-10-CM

## 2013-04-02 DIAGNOSIS — N6320 Unspecified lump in the left breast, unspecified quadrant: Secondary | ICD-10-CM

## 2013-04-02 NOTE — Progress Notes (Signed)
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  She is not taking narcotics, but she is still having a fair amount of soreness in her left breast.  She is wearing a sports bra all the time.  She denies fevers/ chills.  She denies redness of the breast.  She is still taking some ibuprofen and/or tylenol.      EXAM: General:  Alert and oriented Incision:  Well healed.  No erythema or drainage.  Small seroma/hematoma at lateral biopsy site.     PATHOLOGY: Fibroadenoma.     ASSESSMENT AND PLAN:   Mass of multiple sites of left breast No evidence of surgical complications.  Follow up as needed.  Advised pt to minimize caffeine.   Call if breast soreness does not resolve.        Maudry Diego, MD Surgical Oncology, General & Endocrine Surgery [facility]  Emeterio Reeve, MD Emeterio Reeve, MD

## 2013-04-02 NOTE — Assessment & Plan Note (Signed)
No evidence of surgical complications.  Follow up as needed.  Advised pt to minimize caffeine.   Call if breast soreness does not resolve.

## 2013-06-05 ENCOUNTER — Other Ambulatory Visit: Payer: Self-pay | Admitting: Family Medicine

## 2013-06-05 DIAGNOSIS — N6314 Unspecified lump in the right breast, lower inner quadrant: Secondary | ICD-10-CM

## 2013-06-05 DIAGNOSIS — N6320 Unspecified lump in the left breast, unspecified quadrant: Secondary | ICD-10-CM

## 2013-06-11 ENCOUNTER — Ambulatory Visit
Admission: RE | Admit: 2013-06-11 | Discharge: 2013-06-11 | Disposition: A | Discharge status: Home / Self Care | Payer: PRIVATE HEALTH INSURANCE | Source: Ambulatory Visit | Attending: Family Medicine | Admitting: Family Medicine

## 2013-06-11 DIAGNOSIS — N6314 Unspecified lump in the right breast, lower inner quadrant: Secondary | ICD-10-CM

## 2013-06-11 DIAGNOSIS — N6320 Unspecified lump in the left breast, unspecified quadrant: Secondary | ICD-10-CM

## 2013-07-27 ENCOUNTER — Encounter (INDEPENDENT_AMBULATORY_CARE_PROVIDER_SITE_OTHER): Payer: Self-pay | Admitting: General Surgery

## 2013-07-27 ENCOUNTER — Ambulatory Visit (INDEPENDENT_AMBULATORY_CARE_PROVIDER_SITE_OTHER): Payer: PRIVATE HEALTH INSURANCE | Admitting: General Surgery

## 2013-07-27 VITALS — BP 106/62 | HR 66 | Temp 97.6°F | Resp 14 | Ht 64.0 in | Wt 109.4 lb

## 2013-07-27 DIAGNOSIS — N63 Unspecified lump in unspecified breast: Secondary | ICD-10-CM

## 2013-07-27 DIAGNOSIS — N6320 Unspecified lump in the left breast, unspecified quadrant: Secondary | ICD-10-CM

## 2013-07-27 NOTE — Patient Instructions (Signed)
IF YOU ARE TAKING ASPIRIN, COUMADIN/WARFARIN, PLAVIX, OR OTHER BLOOD THINNER, PLEASE LET US KNOW IMMEDIATELY.  WE WILL NEED TO DISCUSS WITH THE PRESCRIBING PROVIDER IF THESE ARE SAFE TO STOP. IF THESE ARE NOT STOPPED AT THE APPROPRIATE TIME, THIS WILL RESULT IN A DELAY FOR YOUR SURGERY.  DO NOT TAKE THESE MEDICATIONS OR IBUPROFEN/NAPROXEN WITHIN A WEEK BEFORE SURGERY.   The main risks of surgery are bleeding, infection, damage to other structures, and seroma (accumulation of fluid) under the incision site(s).    These complications may lead to additional procedures such as drainage of seroma/infection.  If cancer is found, you may need other surgeries to obtain negative margins or to take more lymph nodes.   Most women do accumulate fluid in the breast cavity where the specimen was removed. We do not always have to drain this fluid.  If your breast is very tense, painful, or red, then we may need to numb the skin and use a needle to aspirate the fluid.  We do provide patients with a Breast Binder.  The purpose of this is to avoid the use of tape on the sensitive tissue of the breast and to provide some compression to minimize the risk of seroma.  If the binder is uncomfortable, you may find that a tank top with a built-in shelf bra or a loose sports bra works better for you.  I recommend wearing this around the clock for the first 1-2 weeks except in the shower.    You may remove your dressings and may shower 48 hours after surgery IF YOU DO NOT HAVE A DRAIN.    Many patients have some constipation in the week after surgery due to the narcotics and anesthesia.  You may need over the counter stool softeners or laxatives if you experience difficulty having bowel movements.    If the following occur, call our office at [phone]: If you have a fever over 101 or pain that is severe despite narcotics. If you have redness or drainage at the wound. If you develop persistent nausea or vomiting.  I  will follow you back up in 1-4 weeks.    Please submit any paperwork about time off work/insurance forms to the front desk.   

## 2013-07-29 NOTE — Progress Notes (Signed)
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  She presented to her PCP with a new right sided mass.  She underwent ultrasound which demonstrates aright sided one, and la left sided one.  Her left breast masses were very large before she got them addressed, and she would like to get these taken care of while they are small.  These are slightly sore, but not very painful like the last ones were in the left breast.     PERTINENT REVIEW OF SYSTEMS: Otherwise negative x 11.  Getting married next may  Filed Vitals:   07/27/13 1147  BP: 106/62  Pulse: 66  Temp: 97.6 F (36.4 C)  Resp: 14   Filed Weights   07/27/13 1147  Weight: 109 lb 6.4 oz (49.624 kg)    EXAM: Head: Normocephalic and atraumatic.  Eyes:  Conjunctivae are normal. Pupils are equal, round, and reactive to light. No scleral icterus.  Neck:  Normal range of motion. Neck supple. No tracheal deviation present. No thyromegaly present.  Resp: No respiratory distress, normal effort. Breast:  The right breast has a palpable mobile mass at 10 o'clock, approx 1 cm in greatest dimension.  There are no masses palpable on the left breast.  The prior lumpectomy sites are sl tender.  No lymphadenopathy. Abd:  Abdomen is soft, non distended and non tender. No masses are palpable.  There is no rebound and no guarding.  Neurological: Alert and oriented to person, place, and time. Coordination normal.  Skin: Skin is warm and dry. No rash noted. No diaphoretic. No erythema. No pallor.  Psychiatric: Normal mood and affect. Normal behavior. Judgment and thought content normal.   Ultrasound Findings: Ultrasound is performed, showing there is a well  circumscribed, hypoechoic mass in the left breast at 3 o'clock in  the subareolar location measuring 6 x 4 x 8 mm. There is a well-  circumscribed, hypoechoic mass in the right breast at 10 o'clock 7  cm from the nipple measuring 1.0 x 0.6 x 1.2 cm. No mass or   abnormal shadowing is seen in the inferior aspect of the right  breast.    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   Given how large the last two got, she would like to get these two removed before they get large.   The left one is non palpable.  We will get this one needle localized prior to removal.    I will plan right excisional breast biopsy and left needle localized excisional biopsy.  I advised the patient that she would have similar restrictions to last time.  She will have approximately 1 week of lifting restrictions.  No driving on narcotics.  We reviewed risks of bleeding, pain, infection.  She will schedule around her work schedule.         Maudry Diego, MD Surgical Oncology, General & Endocrine Surgery [facility]  Emeterio Reeve, MD No ref. provider found

## 2013-07-29 NOTE — Assessment & Plan Note (Signed)
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   Given how large the last two got, she would like to get these two removed before they get large.   The left one is non palpable.  We will get this one needle localized prior to removal.    I will plan right excisional breast biopsy and left needle localized excisional biopsy.  I advised the patient that she would have similar restrictions to last time.  She will have approximately 1 week of lifting restrictions.  No driving on narcotics.  We reviewed risks of bleeding, pain, infection.  She will schedule around her work schedule.

## 2013-09-06 ENCOUNTER — Other Ambulatory Visit: Payer: Self-pay

## 2013-09-10 ENCOUNTER — Encounter (HOSPITAL_COMMUNITY): Payer: Self-pay | Admitting: Pharmacy Technician

## 2013-09-11 ENCOUNTER — Encounter (HOSPITAL_COMMUNITY): Payer: Self-pay

## 2013-09-11 ENCOUNTER — Encounter (HOSPITAL_COMMUNITY)
Admission: RE | Admit: 2013-09-11 | Discharge: 2013-09-11 | Disposition: A | Discharge status: Home / Self Care | Payer: PRIVATE HEALTH INSURANCE | Source: Ambulatory Visit | Attending: Anesthesiology | Admitting: Anesthesiology

## 2013-09-11 ENCOUNTER — Encounter (HOSPITAL_COMMUNITY)
Admission: RE | Admit: 2013-09-11 | Discharge: 2013-09-11 | Disposition: A | Discharge status: Home / Self Care | Payer: PRIVATE HEALTH INSURANCE | Source: Ambulatory Visit | Attending: General Surgery | Admitting: General Surgery

## 2013-09-11 DIAGNOSIS — Z01818 Encounter for other preprocedural examination: Secondary | ICD-10-CM | POA: Insufficient documentation

## 2013-09-11 DIAGNOSIS — Z01812 Encounter for preprocedural laboratory examination: Secondary | ICD-10-CM | POA: Insufficient documentation

## 2013-09-11 LAB — CBC
HCT: 39.8 % (ref 36.0–46.0)
Hemoglobin: 13.7 g/dL (ref 12.0–15.0)
MCHC: 34.4 g/dL (ref 30.0–36.0)
MCV: 90.5 fL (ref 78.0–100.0)
RBC: 4.4 MIL/uL (ref 3.87–5.11)
WBC: 5.5 10*3/uL (ref 4.0–10.5)

## 2013-09-11 LAB — BASIC METABOLIC PANEL
BUN: 9 mg/dL (ref 6–23)
CO2: 26 mEq/L (ref 19–32)
Chloride: 101 mEq/L (ref 96–112)
GFR calc Af Amer: >90 mL/min (ref >90)
GFR calc non Af Amer: >90 mL/min (ref >90)
Potassium: 3.7 mEq/L (ref 3.5–5.1)
Sodium: 138 mEq/L (ref 135–145)

## 2013-09-11 LAB — HCG, SERUM, QUALITATIVE: Preg, Serum: NEGATIVE

## 2013-09-11 NOTE — Pre-Procedure Instructions (Signed)
VALENE VILLA  09/11/2013   Your procedure is scheduled on:  Thursday November 20 th at 0930 AM  Report to [facility] Main Entrance "A" at 0730 AM.  Call this number if you have problems the morning of surgery: [phone]   Remember:   Do not eat food or drink liquids after midnight.   Take these medicines the morning of surgery with A SIP OF WATER: Use and Bring inhalers morning of surgery if needed   Stop Aspirin, Nsaids( Naproxen,Aleve, Ibuprofen, Advil), herbal medications and Vitamins 5 days prior to surgery.  Do not wear jewelry, make-up or nail polish.  Do not wear lotions, powders, or perfumes. You may wear deodorant.  Do not shave 48 hours prior to surgery. Men may shave face and neck.  Do not bring valuables to the hospital.  [facility] is not responsible for any belongings or valuables.               Contacts, dentures or bridgework may not be worn into surgery.  Leave suitcase in the car. After surgery it may be brought to your room.  For patients admitted to the hospital, discharge time is determined by your  treatment team.               Patients discharged the day of surgery will not be allowed to drive home.  Name and phone number of your driver:   Special Instructions: Shower using CHG 2 nights before surgery and the night before surgery.  If you shower the day of surgery use CHG.  Use special wash - you have one bottle of CHG for all showers.  You should use approximately 1/3 of the bottle for each shower.   Please read over the following fact sheets that you were given: Pain Booklet, Coughing and Deep Breathing, MRSA Information and Surgical Site Infection Prevention

## 2013-09-19 MED ORDER — CEFAZOLIN SODIUM-DEXTROSE 2-3 GM-% IV SOLR
2.0000 g | INTRAVENOUS | Status: AC
  Administered 2013-09-20: 2 g via INTRAVENOUS
  Filled 2013-09-19: qty 50

## 2013-09-20 ENCOUNTER — Encounter (HOSPITAL_COMMUNITY): Payer: PRIVATE HEALTH INSURANCE | Admitting: Anesthesiology

## 2013-09-20 ENCOUNTER — Other Ambulatory Visit (INDEPENDENT_AMBULATORY_CARE_PROVIDER_SITE_OTHER): Payer: Self-pay | Admitting: General Surgery

## 2013-09-20 ENCOUNTER — Ambulatory Visit (HOSPITAL_COMMUNITY)
Admission: RE | Admit: 2013-09-20 | Discharge: 2013-09-20 | Disposition: A | Discharge status: Home / Self Care | Payer: PRIVATE HEALTH INSURANCE | Source: Ambulatory Visit | Attending: General Surgery | Admitting: General Surgery

## 2013-09-20 ENCOUNTER — Ambulatory Visit (HOSPITAL_COMMUNITY): Payer: PRIVATE HEALTH INSURANCE | Admitting: Anesthesiology

## 2013-09-20 ENCOUNTER — Encounter (HOSPITAL_COMMUNITY): Payer: Self-pay | Admitting: Anesthesiology

## 2013-09-20 ENCOUNTER — Ambulatory Visit
Admission: RE | Admit: 2013-09-20 | Discharge: 2013-09-20 | Disposition: A | Discharge status: Home / Self Care | Payer: PRIVATE HEALTH INSURANCE | Source: Ambulatory Visit | Attending: General Surgery | Admitting: General Surgery

## 2013-09-20 ENCOUNTER — Ambulatory Visit
Admission: RE | Admit: 2013-09-20 | Discharge: 2013-09-20 | Disposition: A | Discharge status: Home / Self Care | Payer: No Typology Code available for payment source | Source: Ambulatory Visit | Attending: General Surgery | Admitting: General Surgery

## 2013-09-20 ENCOUNTER — Encounter (HOSPITAL_COMMUNITY)
Admission: RE | Disposition: A | Discharge status: Home / Self Care | Payer: Self-pay | Source: Ambulatory Visit | Attending: General Surgery

## 2013-09-20 DIAGNOSIS — N6320 Unspecified lump in the left breast, unspecified quadrant: Secondary | ICD-10-CM

## 2013-09-20 DIAGNOSIS — D249 Benign neoplasm of unspecified breast: Secondary | ICD-10-CM | POA: Insufficient documentation

## 2013-09-20 DIAGNOSIS — N6089 Other benign mammary dysplasias of unspecified breast: Secondary | ICD-10-CM | POA: Insufficient documentation

## 2013-09-20 DIAGNOSIS — N6019 Diffuse cystic mastopathy of unspecified breast: Secondary | ICD-10-CM

## 2013-09-20 HISTORY — PX: BREAST BIOPSY: SHX20

## 2013-09-20 SURGERY — BREAST BIOPSY WITH NEEDLE LOCALIZATION
Anesthesia: General | Site: Breast | Laterality: Right | Wound class: Clean

## 2013-09-20 MED ORDER — PHENYLEPHRINE HCL 10 MG/ML IJ SOLN
INTRAMUSCULAR | Status: DC | PRN
  Administered 2013-09-20: 20 ug via INTRAVENOUS
  Administered 2013-09-20: 40 ug via INTRAVENOUS
  Administered 2013-09-20: 20 ug via INTRAVENOUS
  Administered 2013-09-20: 40 ug via INTRAVENOUS

## 2013-09-20 MED ORDER — ARTIFICIAL TEARS OP OINT
TOPICAL_OINTMENT | OPHTHALMIC | Status: DC | PRN
  Administered 2013-09-20: 1 via OPHTHALMIC

## 2013-09-20 MED ORDER — DEXAMETHASONE SODIUM PHOSPHATE 4 MG/ML IJ SOLN
INTRAMUSCULAR | Status: DC | PRN
  Administered 2013-09-20: 8 mg via INTRAVENOUS

## 2013-09-20 MED ORDER — ONDANSETRON HCL 4 MG/2ML IJ SOLN
INTRAMUSCULAR | Status: DC | PRN
  Administered 2013-09-20: 4 mg via INTRAVENOUS

## 2013-09-20 MED ORDER — OXYCODONE-ACETAMINOPHEN 5-325 MG PO TABS: 1.0000 | ORAL_TABLET | ORAL | Status: DC | PRN

## 2013-09-20 MED ORDER — 0.9 % SODIUM CHLORIDE (POUR BTL) OPTIME
TOPICAL | Status: DC | PRN
  Administered 2013-09-20: 1000 mL

## 2013-09-20 MED ORDER — OXYCODONE HCL 5 MG PO TABS: 5.0000 mg | ORAL_TABLET | Freq: Once | ORAL | Status: DC | PRN

## 2013-09-20 MED ORDER — ONDANSETRON HCL 4 MG/2ML IJ SOLN: 4.0000 mg | Freq: Once | INTRAMUSCULAR | Status: DC | PRN

## 2013-09-20 MED ORDER — LIDOCAINE HCL (CARDIAC) 20 MG/ML IV SOLN
INTRAVENOUS | Status: DC | PRN
  Administered 2013-09-20: 20 mg via INTRAVENOUS

## 2013-09-20 MED ORDER — LIDOCAINE HCL (PF) 1 % IJ SOLN
INTRAMUSCULAR | Status: AC
  Filled 2013-09-20: qty 30

## 2013-09-20 MED ORDER — LIDOCAINE HCL 1 % IJ SOLN
INTRAMUSCULAR | Status: DC | PRN
  Administered 2013-09-20: 11:00:00 via SUBCUTANEOUS

## 2013-09-20 MED ORDER — MIDAZOLAM HCL 5 MG/5ML IJ SOLN
INTRAMUSCULAR | Status: DC | PRN
  Administered 2013-09-20 (×3): 1 mg via INTRAVENOUS

## 2013-09-20 MED ORDER — LACTATED RINGERS IV SOLN
INTRAVENOUS | Status: DC
  Administered 2013-09-20: 09:00:00 via INTRAVENOUS

## 2013-09-20 MED ORDER — HYDROMORPHONE HCL PF 1 MG/ML IJ SOLN: 0.2500 mg | INTRAMUSCULAR | Status: DC | PRN

## 2013-09-20 MED ORDER — OXYCODONE HCL 5 MG/5ML PO SOLN: 5.0000 mg | Freq: Once | ORAL | Status: DC | PRN

## 2013-09-20 MED ORDER — FENTANYL CITRATE 0.05 MG/ML IJ SOLN
INTRAMUSCULAR | Status: DC | PRN
  Administered 2013-09-20 (×2): 50 ug via INTRAVENOUS

## 2013-09-20 MED ORDER — DEXTROSE 5 % IV SOLN
INTRAVENOUS | Status: DC | PRN
  Administered 2013-09-20: 10:00:00 via INTRAVENOUS

## 2013-09-20 MED ORDER — LACTATED RINGERS IV SOLN
INTRAVENOUS | Status: DC | PRN
  Administered 2013-09-20 (×2): via INTRAVENOUS

## 2013-09-20 MED ORDER — PROPOFOL 10 MG/ML IV BOLUS
INTRAVENOUS | Status: DC | PRN
  Administered 2013-09-20: 160 mg via INTRAVENOUS

## 2013-09-20 SURGICAL SUPPLY — 57 items
ADH SKN CLS APL DERMABOND .7 (GAUZE/BANDAGES/DRESSINGS) ×2
APPLIER CLIP 9.375 MED OPEN (MISCELLANEOUS)
APR CLP MED 9.3 20 MLT OPN (MISCELLANEOUS)
BINDER BREAST LRG (GAUZE/BANDAGES/DRESSINGS) IMPLANT
BINDER BREAST MEDIUM (GAUZE/BANDAGES/DRESSINGS) ×1 IMPLANT
BINDER BREAST XLRG (GAUZE/BANDAGES/DRESSINGS) IMPLANT
BLADE SURG 15 STRL LF DISP TIS (BLADE) ×2 IMPLANT
BLADE SURG 15 STRL SS (BLADE) ×3
CANISTER SUCTION 2500CC (MISCELLANEOUS) ×3 IMPLANT
CHLORAPREP W/TINT 26ML (MISCELLANEOUS) ×3 IMPLANT
CLIP APPLIE 9.375 MED OPEN (MISCELLANEOUS) IMPLANT
CONT SPEC 4OZ CLIKSEAL STRL BL (MISCELLANEOUS) ×1 IMPLANT
COVER SURGICAL LIGHT HANDLE (MISCELLANEOUS) ×3 IMPLANT
DECANTER SPIKE VIAL GLASS SM (MISCELLANEOUS) ×6 IMPLANT
DERMABOND ADVANCED (GAUZE/BANDAGES/DRESSINGS) ×1
DERMABOND ADVANCED .7 DNX12 (GAUZE/BANDAGES/DRESSINGS) ×2 IMPLANT
DEVICE DUBIN SPECIMEN MAMMOGRA (MISCELLANEOUS) ×3 IMPLANT
DRAPE CHEST BREAST 15X10 FENES (DRAPES) ×3 IMPLANT
DRAPE PED LAPAROTOMY (DRAPES) ×3 IMPLANT
DRAPE UTILITY 15X26 W/TAPE STR (DRAPE) ×7 IMPLANT
DRSG PAD ABDOMINAL 8X10 ST (GAUZE/BANDAGES/DRESSINGS) ×2 IMPLANT
ELECT CAUTERY BLADE 6.4 (BLADE) ×3 IMPLANT
ELECT REM PT RETURN 9FT ADLT (ELECTROSURGICAL) ×3
ELECTRODE REM PT RTRN 9FT ADLT (ELECTROSURGICAL) ×2 IMPLANT
GLOVE BIO SURGEON STRL SZ 6 (GLOVE) ×3 IMPLANT
GLOVE BIOGEL PI IND STRL 6.5 (GLOVE) ×2 IMPLANT
GLOVE BIOGEL PI INDICATOR 6.5 (GLOVE) ×1
GOWN PREVENTION PLUS XXLARGE (GOWN DISPOSABLE) ×6 IMPLANT
GOWN STRL NON-REIN LRG LVL3 (GOWN DISPOSABLE) ×3 IMPLANT
KIT BASIN OR (CUSTOM PROCEDURE TRAY) ×3 IMPLANT
KIT MARKER MARGIN INK (KITS) IMPLANT
KIT ROOM TURNOVER OR (KITS) ×3 IMPLANT
NDL HYPO 25GX1X1/2 BEV (NEEDLE) ×2 IMPLANT
NEEDLE HYPO 25GX1X1/2 BEV (NEEDLE) ×3 IMPLANT
NS IRRIG 1000ML POUR BTL (IV SOLUTION) ×3 IMPLANT
PACK GENERAL/GYN (CUSTOM PROCEDURE TRAY) ×3 IMPLANT
PACK SURGICAL SETUP 50X90 (CUSTOM PROCEDURE TRAY) ×3 IMPLANT
PAD ARMBOARD 7.5X6 YLW CONV (MISCELLANEOUS) ×6 IMPLANT
PENCIL BUTTON HOLSTER BLD 10FT (ELECTRODE) ×3 IMPLANT
SPONGE GAUZE 4X4 12PLY (GAUZE/BANDAGES/DRESSINGS) ×1 IMPLANT
SPONGE LAP 18X18 X RAY DECT (DISPOSABLE) ×3 IMPLANT
STAPLER VISISTAT 35W (STAPLE) ×3 IMPLANT
STRIP CLOSURE SKIN 1/2X4 (GAUZE/BANDAGES/DRESSINGS) ×1 IMPLANT
SUT MNCRL AB 4-0 PS2 18 (SUTURE) ×3 IMPLANT
SUT MON AB 4-0 PC3 18 (SUTURE) ×3 IMPLANT
SUT SILK 2 0 FS (SUTURE) IMPLANT
SUT SILK 2 0 SH (SUTURE) IMPLANT
SUT VIC AB 3-0 SH 27 (SUTURE) ×3
SUT VIC AB 3-0 SH 27X BRD (SUTURE) ×2 IMPLANT
SUT VIC AB 3-0 SH 8-18 (SUTURE) ×3 IMPLANT
SYR BULB 3OZ (MISCELLANEOUS) ×2 IMPLANT
SYR CONTROL 10ML LL (SYRINGE) ×4 IMPLANT
TOWEL OR 17X24 6PK STRL BLUE (TOWEL DISPOSABLE) ×3 IMPLANT
TOWEL OR 17X26 10 PK STRL BLUE (TOWEL DISPOSABLE) ×3 IMPLANT
TUBE CONNECTING 12X1/4 (SUCTIONS) ×2 IMPLANT
WATER STERILE IRR 1000ML POUR (IV SOLUTION) IMPLANT
YANKAUER SUCT BULB TIP NO VENT (SUCTIONS) ×2 IMPLANT

## 2013-09-20 NOTE — Preoperative (Signed)
Beta Blockers   Reason not to administer Beta Blockers:Not Applicable 

## 2013-09-20 NOTE — Anesthesia Preprocedure Evaluation (Signed)
Anesthesia Evaluation  Patient identified by MRN, date of birth, ID band Patient awake    Reviewed: Allergy & Precautions, H&P , NPO status , Patient's Chart, lab work & pertinent test results  Airway Mallampati: I TM Distance: >3 FB Neck ROM: Full    Dental no notable dental hx. (+) Teeth Intact and Dental Advisory Given   Pulmonary  breath sounds clear to auscultation        Cardiovascular Rhythm:Regular Rate:Normal     Neuro/Psych    GI/Hepatic   Endo/Other    Renal/GU      Musculoskeletal   Abdominal   Peds  Hematology   Anesthesia Other Findings   Reproductive/Obstetrics                           Anesthesia Physical Anesthesia Plan  ASA: I  Anesthesia Plan: General   Post-op Pain Management:    Induction: Intravenous  Airway Management Planned: LMA  Additional Equipment:   Intra-op Plan:   Post-operative Plan:   Informed Consent: I have reviewed the patients History and Physical, chart, labs and discussed the procedure including the risks, benefits and alternatives for the proposed anesthesia with the patient or authorized representative who has indicated his/her understanding and acceptance.   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

## 2013-09-20 NOTE — Anesthesia Procedure Notes (Signed)
Procedure Name: LMA Insertion Date/Time: 09/20/2013 9:46 AM Performed by: Marni Griffon Pre-anesthesia Checklist: Patient identified, Emergency Drugs available, Suction available and Patient being monitored Patient Re-evaluated:Patient Re-evaluated prior to inductionOxygen Delivery Method: Circle system utilized Preoxygenation: Pre-oxygenation with 100% oxygen Intubation Type: IV induction Ventilation: Mask ventilation without difficulty LMA: LMA inserted LMA Size: 4.0 Number of attempts: 1 Placement Confirmation: breath sounds checked- equal and bilateral and positive ETCO2 Tube secured with: Tape (taped across cheeks; guaze roll b/t teeth) Dental Injury: Teeth and Oropharynx as per pre-operative assessment

## 2013-09-20 NOTE — Transfer of Care (Signed)
Immediate Anesthesia Transfer of Care Note  Patient: Janet Allen  Procedure(s) Performed: Procedure(s) with comments: BREAST mass WITH NEEDLE LOCALIZATION (Left) - needle loc BCG 7:30 BREAST mass (Right)  Patient Location: PACU  Anesthesia Type:General  Level of Consciousness: awake, alert  and patient cooperative  Airway & Oxygen Therapy: Patient Spontanous Breathing and Patient connected to nasal cannula oxygen  Post-op Assessment: Report given to PACU RN, Post -op Vital signs reviewed and stable and Patient moving all extremities  Post vital signs: Reviewed and stable  Complications: No apparent anesthesia complications

## 2013-09-20 NOTE — Op Note (Signed)
Left Breast needle localized excisional biopsy and excision of right breast mass  Indications: This patient presents with history of a bilateral breast masses, with the left side only visible on imaging.  The right side is palpable. The patient has a history of numerous fibroadenomas.    Pre-operative Diagnosis: bilateral breast mass  Post-operative Diagnosis: bilateral breast mass  Surgeon: Almond Lint   Assistants: n/a  Anesthesia: General LMA anesthesia and Local anesthesia 1% plain lidocaine, 0.25.% bupivacaine, with epinephrine  ASA Class: 1  Procedure Details  The patient was seen in the Holding Room. The risks, benefits, complications, treatment options, and expected outcomes were discussed with the patient. The possibilities of reaction to medication, pulmonary aspiration, bleeding, infection, the need for additional procedures, failure to diagnose a condition, and creating a complication requiring transfusion or operation were discussed with the patient. The patient concurred with the proposed plan, giving informed consent. The site of surgery properly noted/marked. The patient was taken to Operating Room, identified as Janet Allen, and the procedure verified as above. A Time Out was held and the above information confirmed.  After induction of anesthesia, both  breasts and chest were prepped and draped in standard fashion. The left lumpectomy was performed by opening the previous  incision in the inferior midline of the breast.  Skin hooks were used to assist making the flap.  The wire was pulled into the breast, and the breast tissue of concern was elevated with the Allis clamp along with the wire.  Sharp dissection was used to take out the mass and the wire.  Orientation sutures were placed, long lateral and short superior, and hemostasis was achieved with cautery. The wound was irrigated and closed with 3-0 vicryl deep dermal sutures and 4-0 Monocryl subcuticular closure in  layers.  The right breast was then addressed.  An oblique incision was made over the palpable mass at 10 o'clock.  The breast tissue was dissected out with the cautery.  The mass was identified and elevated with an Allis clamp.  The mass was excised sharply and with the cautery.  Orientation sutures were placed, long lateral and short superior, and hemostasis was achieved with cautery. The wound was irrigated and closed with 3-0 vicryl deep dermal sutures and 4-0 Monocryl subcuticular closure in layers.  Sterile dressings were applied. At the end of the operation, all sponge, instrument, and needle counts were correct.  Findings: grossly clear surgical margins and EXTREMELY dense breast tissue  Estimated Blood Loss:  Minimal         Specimens: breast mass             Complications:  None; patient tolerated the procedure well.         Disposition: PACU - hemodynamically stable.  -

## 2013-09-20 NOTE — Anesthesia Postprocedure Evaluation (Signed)
  Anesthesia Post-op Note  Patient: Janet Allen  Procedure(s) Performed: Procedure(s) with comments: BREAST mass WITH NEEDLE LOCALIZATION (Left) - needle loc BCG 7:30 BREAST mass (Right)  Patient Location: PACU  Anesthesia Type:General  Level of Consciousness: awake, alert  and oriented  Airway and Oxygen Therapy: Patient Spontanous Breathing and Patient connected to nasal cannula oxygen  Post-op Pain: mild  Post-op Assessment: Post-op Vital signs reviewed, Patient's Cardiovascular Status Stable, Respiratory Function Stable, Patent Airway and Pain level controlled  Post-op Vital Signs: stable  Complications: No apparent anesthesia complications

## 2013-09-20 NOTE — H&P (Signed)
  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 She presented to her PCP with a new right sided mass. She underwent ultrasound which demonstrates aright sided one, and la left sided one. Her left breast masses were very large before she got them addressed, and she would like to get these taken care of while they are small. These are slightly sore, but not very painful like the last ones were in the left breast.  She has no change since I saw her last.    PERTINENT REVIEW OF SYSTEMS:  Otherwise negative x 11.  Wt Readings from Last 3 Encounters:  09/11/13 111 lb (50.349 kg)  07/27/13 109 lb 6.4 oz (49.624 kg)  04/02/13 112 lb (50.803 kg)   Temp Readings from Last 3 Encounters:  09/20/13 97.2 F (36.2 C) Oral  09/20/13 97.2 F (36.2 C) Oral  09/11/13 98.5 F (36.9 C)    BP Readings from Last 3 Encounters:  09/20/13 140/80  09/20/13 140/80  09/11/13 132/82   Pulse Readings from Last 3 Encounters:  09/20/13 97  09/20/13 97  09/11/13 104    EXAM:  Head: Normocephalic and atraumatic.  Eyes: Conjunctivae are normal. Pupils are equal, round, and reactive to light. No scleral icterus.  Resp: No respiratory distress, normal effort.  Breast: The right breast has a palpable mobile mass at 10 o'clock, approx 1 cm in greatest dimension. There are no masses palpable on the left breast. The prior lumpectomy sites are sl tender. No lymphadenopathy.   Neurological: Alert and oriented to person, place, and time. Coordination normal.  Skin: Skin is warm and dry. No rash noted. No diaphoretic. No erythema. No pallor.  Psychiatric: Normal mood and affect. Normal behavior. Judgment and thought content normal.   Ultrasound  Findings: Ultrasound is performed, showing there is a well  circumscribed, hypoechoic mass in the left breast at 3 o'clock in  the subareolar location measuring 6 x 4 x 8 mm. There is a well-  circumscribed, hypoechoic mass in the right  breast at 10 o'clock 7  cm from the nipple measuring 1.0 x 0.6 x 1.2 cm. No mass or  abnormal shadowing is seen in the inferior aspect of the right  Breast.  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  Given how large the last two got, she would like to get these two removed before they get large.  Wire placed for non palpable left breast mass. I will plan right excisional breast biopsy and left needle localized excisional biopsy.  I advised the patient that she would have similar restrictions to last time. She will have approximately 1 week of lifting restrictions. No driving on narcotics. We reviewed risks of bleeding, pain, infection. She will schedule around her work schedule.   Maudry Diego, MD  Surgical Oncology, General & Endocrine Surgery  [facility]   Emeterio Reeve, MD  No ref. provider found

## 2013-09-21 ENCOUNTER — Encounter (HOSPITAL_COMMUNITY): Payer: Self-pay | Admitting: General Surgery

## 2013-09-25 ENCOUNTER — Telehealth (INDEPENDENT_AMBULATORY_CARE_PROVIDER_SITE_OTHER): Payer: Self-pay

## 2013-09-25 ENCOUNTER — Other Ambulatory Visit (INDEPENDENT_AMBULATORY_CARE_PROVIDER_SITE_OTHER): Payer: Self-pay | Admitting: General Surgery

## 2013-09-25 DIAGNOSIS — R5381 Other malaise: Secondary | ICD-10-CM

## 2013-09-25 LAB — CBC WITH DIFFERENTIAL/PLATELET
Basophils Absolute: 0 10*3/uL (ref 0.0–0.1)
Eosinophils Absolute: 0.3 10*3/uL (ref 0.0–0.7)
Hemoglobin: 13.9 g/dL (ref 12.0–15.0)
Lymphocytes Relative: 31 % (ref 12–46)
Lymphs Abs: 1.8 10*3/uL (ref 0.7–4.0)
MCH: 30.8 pg (ref 26.0–34.0)
MCV: 87.4 fL (ref 78.0–100.0)
Monocytes Absolute: 0.6 10*3/uL (ref 0.1–1.0)
Monocytes Relative: 10 % (ref 3–12)
Neutrophils Relative %: 53 % (ref 43–77)
Platelets: 280 10*3/uL (ref 150–400)
RBC: 4.52 MIL/uL (ref 3.87–5.11)

## 2013-09-25 NOTE — Telephone Encounter (Signed)
Not sure what to make of that.  Can we get a CBC?   tx FB

## 2013-09-25 NOTE — Telephone Encounter (Signed)
Pt will go to [facility] today to have CBC drawn.  We will call her with these results.

## 2013-09-25 NOTE — Telephone Encounter (Signed)
Pt calling 5 days post breast mass excision.  Pt denies n/v/f.  States she is not in any pain.  Has not taken any pain medications after her first night post op.  Pt states that she is weak and drowsy.  States that she had same procedure in May and was back at work this far out.  She states that she can not "return to her normal life" right now because of the constant drowsiness.  Let her know I would send message to Dr. Donell Beers and Cyndra Numbers.

## 2013-09-26 NOTE — Telephone Encounter (Signed)
Pt was notified her labs were normal.

## 2013-10-05 ENCOUNTER — Ambulatory Visit (INDEPENDENT_AMBULATORY_CARE_PROVIDER_SITE_OTHER): Payer: PRIVATE HEALTH INSURANCE | Admitting: General Surgery

## 2013-10-05 ENCOUNTER — Encounter (INDEPENDENT_AMBULATORY_CARE_PROVIDER_SITE_OTHER): Payer: Self-pay | Admitting: General Surgery

## 2013-10-05 VITALS — BP 110/66 | HR 72 | Temp 98.9°F | Resp 14 | Ht 64.0 in | Wt 110.8 lb

## 2013-10-05 DIAGNOSIS — N63 Unspecified lump in unspecified breast: Secondary | ICD-10-CM

## 2013-10-05 NOTE — Progress Notes (Signed)
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 I took out several fibroadenomas earlier this year. She presented with recurrent bilateral breast masses or but felt to be fibroadenomas. These were removed at a much smaller size than her previous ones were. She had some significant postoperative fatigue that was a lot worse than last time. She has not had any significant discomfort. She states the pain was much better this time.    EXAM: General:  alert and oriented x3. No distress. Incision:  Bilateral breast incisions are well-healed. She does have a small hematoma on the right side. There is no evidence of infection. The wounds are nontender.   PATHOLOGY: Diagnosis 1. Breast, lumpectomy, Left - FIBROADENOMA. - FIBROCYSTIC CHANGES. - NO ATYPIA, HYPERPLASIA, OR MALIGNANCY IDENTIFIED. 2. Breast, excision, Right - PSEUDOANGIOMATOUS STROMAL HYPERPLASIA. - NO ATYPIA OR MALIGNANCY IDENTIFIED.   ASSESSMENT AND PLAN:   bilateral breast masses No evidence of surgical complications. Patient's fatigue has resolved. She will followup on a when necessary basis.    Maudry Diego, MD Surgical Oncology, General & Endocrine Surgery [facility]  Emeterio Reeve, MD Emeterio Reeve, MD

## 2013-10-05 NOTE — Assessment & Plan Note (Signed)
No evidence of surgical complications. Patient's fatigue has resolved. She will followup on a when necessary basis.

## 2013-10-05 NOTE — Patient Instructions (Signed)
Follow up as needed.  Let us know if you have other issues.    Have a good Christmas!!!

## 2013-11-30 ENCOUNTER — Other Ambulatory Visit: Payer: Self-pay | Admitting: Family Medicine

## 2013-11-30 DIAGNOSIS — R591 Generalized enlarged lymph nodes: Secondary | ICD-10-CM

## 2013-12-05 ENCOUNTER — Ambulatory Visit
Admission: RE | Admit: 2013-12-05 | Discharge: 2013-12-05 | Disposition: A | Discharge status: Home / Self Care | Payer: No Typology Code available for payment source | Source: Ambulatory Visit | Attending: Family Medicine | Admitting: Family Medicine

## 2013-12-05 DIAGNOSIS — R591 Generalized enlarged lymph nodes: Secondary | ICD-10-CM

## 2013-12-31 ENCOUNTER — Ambulatory Visit
Admission: RE | Admit: 2013-12-31 | Discharge: 2013-12-31 | Disposition: A | Discharge status: Home / Self Care | Payer: No Typology Code available for payment source | Source: Ambulatory Visit | Attending: Family Medicine | Admitting: Family Medicine

## 2013-12-31 ENCOUNTER — Other Ambulatory Visit: Payer: Self-pay | Admitting: Family Medicine

## 2013-12-31 DIAGNOSIS — R0602 Shortness of breath: Secondary | ICD-10-CM

## 2014-01-04 ENCOUNTER — Other Ambulatory Visit (HOSPITAL_COMMUNITY): Payer: PRIVATE HEALTH INSURANCE

## 2014-01-09 ENCOUNTER — Other Ambulatory Visit: Payer: Self-pay

## 2014-01-09 ENCOUNTER — Encounter: Payer: Self-pay | Admitting: Cardiovascular Disease

## 2014-01-09 ENCOUNTER — Ambulatory Visit (HOSPITAL_COMMUNITY): Payer: PRIVATE HEALTH INSURANCE | Attending: Cardiovascular Disease | Admitting: Radiology

## 2014-01-09 ENCOUNTER — Other Ambulatory Visit (HOSPITAL_COMMUNITY): Payer: Self-pay | Admitting: Family Medicine

## 2014-01-09 DIAGNOSIS — R0602 Shortness of breath: Secondary | ICD-10-CM | POA: Insufficient documentation

## 2014-01-09 DIAGNOSIS — R002 Palpitations: Secondary | ICD-10-CM

## 2014-01-09 NOTE — Progress Notes (Signed)
Echocardiogram performed.  

## 2014-06-29 ENCOUNTER — Emergency Department (HOSPITAL_BASED_OUTPATIENT_CLINIC_OR_DEPARTMENT_OTHER)
Admission: EM | Admit: 2014-06-29 | Discharge: 2014-06-30 | Disposition: A | Discharge status: Home / Self Care | Payer: PRIVATE HEALTH INSURANCE | Attending: Emergency Medicine | Admitting: Emergency Medicine

## 2014-06-29 ENCOUNTER — Encounter (HOSPITAL_BASED_OUTPATIENT_CLINIC_OR_DEPARTMENT_OTHER): Payer: Self-pay | Admitting: Emergency Medicine

## 2014-06-29 ENCOUNTER — Emergency Department (HOSPITAL_BASED_OUTPATIENT_CLINIC_OR_DEPARTMENT_OTHER): Payer: PRIVATE HEALTH INSURANCE

## 2014-06-29 DIAGNOSIS — S4980XA Other specified injuries of shoulder and upper arm, unspecified arm, initial encounter: Secondary | ICD-10-CM | POA: Insufficient documentation

## 2014-06-29 DIAGNOSIS — IMO0002 Reserved for concepts with insufficient information to code with codable children: Secondary | ICD-10-CM | POA: Diagnosis not present

## 2014-06-29 DIAGNOSIS — R011 Cardiac murmur, unspecified: Secondary | ICD-10-CM | POA: Insufficient documentation

## 2014-06-29 DIAGNOSIS — Y92410 Unspecified street and highway as the place of occurrence of the external cause: Secondary | ICD-10-CM | POA: Insufficient documentation

## 2014-06-29 DIAGNOSIS — S20219A Contusion of unspecified front wall of thorax, initial encounter: Secondary | ICD-10-CM | POA: Insufficient documentation

## 2014-06-29 DIAGNOSIS — S298XXA Other specified injuries of thorax, initial encounter: Secondary | ICD-10-CM | POA: Insufficient documentation

## 2014-06-29 DIAGNOSIS — S0993XA Unspecified injury of face, initial encounter: Secondary | ICD-10-CM | POA: Insufficient documentation

## 2014-06-29 DIAGNOSIS — J45909 Unspecified asthma, uncomplicated: Secondary | ICD-10-CM | POA: Diagnosis not present

## 2014-06-29 DIAGNOSIS — Z79899 Other long term (current) drug therapy: Secondary | ICD-10-CM | POA: Insufficient documentation

## 2014-06-29 DIAGNOSIS — Z8701 Personal history of pneumonia (recurrent): Secondary | ICD-10-CM | POA: Insufficient documentation

## 2014-06-29 DIAGNOSIS — H539 Unspecified visual disturbance: Secondary | ICD-10-CM | POA: Insufficient documentation

## 2014-06-29 DIAGNOSIS — R42 Dizziness and giddiness: Secondary | ICD-10-CM | POA: Insufficient documentation

## 2014-06-29 DIAGNOSIS — S20212A Contusion of left front wall of thorax, initial encounter: Secondary | ICD-10-CM

## 2014-06-29 DIAGNOSIS — Z87440 Personal history of urinary (tract) infections: Secondary | ICD-10-CM | POA: Insufficient documentation

## 2014-06-29 DIAGNOSIS — Y9389 Activity, other specified: Secondary | ICD-10-CM | POA: Diagnosis not present

## 2014-06-29 DIAGNOSIS — S46909A Unspecified injury of unspecified muscle, fascia and tendon at shoulder and upper arm level, unspecified arm, initial encounter: Secondary | ICD-10-CM | POA: Diagnosis not present

## 2014-06-29 DIAGNOSIS — S199XXA Unspecified injury of neck, initial encounter: Secondary | ICD-10-CM
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
## 2014-07-02 ENCOUNTER — Ambulatory Visit
Admission: RE | Admit: 2014-07-02 | Discharge: 2014-07-02 | Disposition: A | Discharge status: Home / Self Care | Payer: PRIVATE HEALTH INSURANCE | Source: Ambulatory Visit | Attending: Family Medicine | Admitting: Family Medicine

## 2014-07-02 ENCOUNTER — Other Ambulatory Visit: Payer: Self-pay | Admitting: Family Medicine

## 2014-07-02 DIAGNOSIS — R079 Chest pain, unspecified: Secondary | ICD-10-CM

## 2014-07-11 ENCOUNTER — Ambulatory Visit: Payer: PRIVATE HEALTH INSURANCE | Attending: Family Medicine | Admitting: Physical Therapy

## 2014-07-11 DIAGNOSIS — M546 Pain in thoracic spine: Secondary | ICD-10-CM | POA: Insufficient documentation

## 2014-07-11 DIAGNOSIS — R5381 Other malaise: Secondary | ICD-10-CM | POA: Insufficient documentation

## 2014-07-11 DIAGNOSIS — M542 Cervicalgia: Secondary | ICD-10-CM | POA: Insufficient documentation

## 2014-07-11 DIAGNOSIS — IMO0001 Reserved for inherently not codable concepts without codable children: Secondary | ICD-10-CM | POA: Diagnosis present

## 2014-07-12 ENCOUNTER — Ambulatory Visit: Payer: PRIVATE HEALTH INSURANCE | Admitting: Physical Therapy

## 2014-07-15 ENCOUNTER — Ambulatory Visit: Payer: PRIVATE HEALTH INSURANCE | Admitting: Physical Therapy

## 2014-07-15 DIAGNOSIS — IMO0001 Reserved for inherently not codable concepts without codable children: Secondary | ICD-10-CM | POA: Diagnosis not present

## 2014-07-17 ENCOUNTER — Ambulatory Visit: Payer: PRIVATE HEALTH INSURANCE | Admitting: Physical Therapy

## 2014-07-18 ENCOUNTER — Ambulatory Visit: Payer: PRIVATE HEALTH INSURANCE | Admitting: Physical Therapy

## 2014-07-18 DIAGNOSIS — IMO0001 Reserved for inherently not codable concepts without codable children: Secondary | ICD-10-CM | POA: Diagnosis not present

## 2014-07-22 ENCOUNTER — Ambulatory Visit: Payer: PRIVATE HEALTH INSURANCE | Admitting: Physical Therapy

## 2014-07-22 DIAGNOSIS — IMO0001 Reserved for inherently not codable concepts without codable children: Secondary | ICD-10-CM | POA: Diagnosis not present

## 2014-07-24 ENCOUNTER — Ambulatory Visit: Payer: PRIVATE HEALTH INSURANCE

## 2014-07-24 DIAGNOSIS — IMO0001 Reserved for inherently not codable concepts without codable children: Secondary | ICD-10-CM | POA: Diagnosis not present

## 2014-07-26 ENCOUNTER — Ambulatory Visit: Payer: PRIVATE HEALTH INSURANCE | Admitting: Physical Therapy

## 2014-07-26 DIAGNOSIS — IMO0001 Reserved for inherently not codable concepts without codable children: Secondary | ICD-10-CM | POA: Diagnosis not present

## 2014-07-29 ENCOUNTER — Ambulatory Visit: Payer: PRIVATE HEALTH INSURANCE | Admitting: Physical Therapy

## 2014-07-29 DIAGNOSIS — IMO0001 Reserved for inherently not codable concepts without codable children: Secondary | ICD-10-CM | POA: Diagnosis not present

## 2014-07-31 ENCOUNTER — Ambulatory Visit: Payer: PRIVATE HEALTH INSURANCE | Admitting: Physical Therapy

## 2014-07-31 DIAGNOSIS — IMO0001 Reserved for inherently not codable concepts without codable children: Secondary | ICD-10-CM | POA: Diagnosis not present

## 2014-08-02 ENCOUNTER — Ambulatory Visit: Payer: PRIVATE HEALTH INSURANCE | Attending: Family Medicine | Admitting: Physical Therapy

## 2014-08-02 DIAGNOSIS — Z5189 Encounter for other specified aftercare: Secondary | ICD-10-CM | POA: Insufficient documentation

## 2014-08-02 DIAGNOSIS — R5381 Other malaise: Secondary | ICD-10-CM | POA: Diagnosis not present

## 2014-08-02 DIAGNOSIS — M546 Pain in thoracic spine: Secondary | ICD-10-CM | POA: Diagnosis not present

## 2014-08-02 DIAGNOSIS — M542 Cervicalgia: Secondary | ICD-10-CM | POA: Diagnosis not present

## 2014-08-05 ENCOUNTER — Ambulatory Visit: Payer: PRIVATE HEALTH INSURANCE | Admitting: Physical Therapy

## 2014-08-05 DIAGNOSIS — Z5189 Encounter for other specified aftercare: Secondary | ICD-10-CM | POA: Diagnosis not present

## 2014-08-07 ENCOUNTER — Ambulatory Visit: Payer: PRIVATE HEALTH INSURANCE | Admitting: Physical Therapy

## 2014-08-07 DIAGNOSIS — Z5189 Encounter for other specified aftercare: Secondary | ICD-10-CM | POA: Diagnosis not present

## 2014-08-09 ENCOUNTER — Ambulatory Visit: Payer: PRIVATE HEALTH INSURANCE | Admitting: Physical Therapy

## 2014-08-09 DIAGNOSIS — Z5189 Encounter for other specified aftercare: Secondary | ICD-10-CM | POA: Diagnosis not present

## 2014-08-13 ENCOUNTER — Ambulatory Visit: Payer: PRIVATE HEALTH INSURANCE | Admitting: Physical Therapy

## 2014-08-13 DIAGNOSIS — Z5189 Encounter for other specified aftercare: Secondary | ICD-10-CM | POA: Diagnosis not present

## 2014-08-15 ENCOUNTER — Ambulatory Visit: Payer: PRIVATE HEALTH INSURANCE | Admitting: Physical Therapy

## 2014-08-15 DIAGNOSIS — Z5189 Encounter for other specified aftercare: Secondary | ICD-10-CM | POA: Diagnosis not present

## 2014-08-16 ENCOUNTER — Other Ambulatory Visit: Payer: Self-pay

## 2014-08-19 ENCOUNTER — Ambulatory Visit: Payer: PRIVATE HEALTH INSURANCE | Admitting: Physical Therapy

## 2014-08-21 ENCOUNTER — Ambulatory Visit: Payer: PRIVATE HEALTH INSURANCE | Admitting: Physical Therapy

## 2014-08-21 DIAGNOSIS — Z5189 Encounter for other specified aftercare: Secondary | ICD-10-CM | POA: Diagnosis not present

## 2014-08-23 ENCOUNTER — Ambulatory Visit: Payer: PRIVATE HEALTH INSURANCE | Admitting: Physical Therapy

## 2014-08-23 DIAGNOSIS — Z5189 Encounter for other specified aftercare: Secondary | ICD-10-CM | POA: Diagnosis not present

## 2014-08-26 ENCOUNTER — Ambulatory Visit: Payer: PRIVATE HEALTH INSURANCE | Admitting: Physical Therapy

## 2014-08-26 DIAGNOSIS — Z5189 Encounter for other specified aftercare: Secondary | ICD-10-CM | POA: Diagnosis not present

## 2014-08-29 ENCOUNTER — Ambulatory Visit: Payer: PRIVATE HEALTH INSURANCE | Admitting: Physical Therapy

## 2014-08-29 DIAGNOSIS — Z5189 Encounter for other specified aftercare: Secondary | ICD-10-CM | POA: Diagnosis not present

## 2014-08-30 ENCOUNTER — Ambulatory Visit: Payer: PRIVATE HEALTH INSURANCE | Admitting: Physical Therapy

## 2014-08-30 DIAGNOSIS — Z5189 Encounter for other specified aftercare: Secondary | ICD-10-CM | POA: Diagnosis not present

## 2014-09-02 ENCOUNTER — Encounter: Payer: PRIVATE HEALTH INSURANCE | Admitting: Physical Therapy

## 2014-09-03 ENCOUNTER — Ambulatory Visit: Payer: PRIVATE HEALTH INSURANCE | Attending: Family Medicine | Admitting: Physical Therapy

## 2014-09-03 DIAGNOSIS — M546 Pain in thoracic spine: Secondary | ICD-10-CM | POA: Insufficient documentation

## 2014-09-03 DIAGNOSIS — Z5189 Encounter for other specified aftercare: Secondary | ICD-10-CM | POA: Diagnosis present

## 2014-09-03 DIAGNOSIS — R5381 Other malaise: Secondary | ICD-10-CM | POA: Diagnosis not present

## 2014-09-03 DIAGNOSIS — M542 Cervicalgia: Secondary | ICD-10-CM | POA: Diagnosis not present

## 2014-09-04 ENCOUNTER — Ambulatory Visit: Payer: PRIVATE HEALTH INSURANCE

## 2014-09-04 DIAGNOSIS — Z5189 Encounter for other specified aftercare: Secondary | ICD-10-CM | POA: Diagnosis not present

## 2014-09-06 ENCOUNTER — Ambulatory Visit: Payer: PRIVATE HEALTH INSURANCE | Admitting: Physical Therapy

## 2014-09-06 DIAGNOSIS — Z5189 Encounter for other specified aftercare: Secondary | ICD-10-CM | POA: Diagnosis not present

## 2014-09-09 ENCOUNTER — Ambulatory Visit: Payer: PRIVATE HEALTH INSURANCE | Admitting: Physical Therapy

## 2014-09-09 DIAGNOSIS — Z5189 Encounter for other specified aftercare: Secondary | ICD-10-CM | POA: Diagnosis not present

## 2014-09-11 ENCOUNTER — Ambulatory Visit: Payer: PRIVATE HEALTH INSURANCE

## 2014-09-11 DIAGNOSIS — Z5189 Encounter for other specified aftercare: Secondary | ICD-10-CM | POA: Diagnosis not present

## 2014-09-13 ENCOUNTER — Ambulatory Visit: Payer: PRIVATE HEALTH INSURANCE | Admitting: Physical Therapy

## 2014-09-13 DIAGNOSIS — Z5189 Encounter for other specified aftercare: Secondary | ICD-10-CM | POA: Diagnosis not present

## 2014-09-16 ENCOUNTER — Ambulatory Visit: Payer: PRIVATE HEALTH INSURANCE | Admitting: Physical Therapy

## 2014-09-16 DIAGNOSIS — Z5189 Encounter for other specified aftercare: Secondary | ICD-10-CM | POA: Diagnosis not present

## 2014-09-18 ENCOUNTER — Ambulatory Visit: Payer: PRIVATE HEALTH INSURANCE

## 2014-09-18 DIAGNOSIS — Z5189 Encounter for other specified aftercare: Secondary | ICD-10-CM | POA: Diagnosis not present

## 2014-09-19 ENCOUNTER — Ambulatory Visit: Payer: PRIVATE HEALTH INSURANCE | Admitting: Physical Therapy

## 2014-09-19 DIAGNOSIS — Z5189 Encounter for other specified aftercare: Secondary | ICD-10-CM | POA: Diagnosis not present

## 2014-09-24 ENCOUNTER — Ambulatory Visit: Payer: PRIVATE HEALTH INSURANCE | Admitting: Physical Therapy

## 2014-09-24 DIAGNOSIS — Z5189 Encounter for other specified aftercare: Secondary | ICD-10-CM | POA: Diagnosis not present

## 2014-09-30 ENCOUNTER — Ambulatory Visit: Payer: PRIVATE HEALTH INSURANCE | Admitting: Physical Therapy

## 2014-09-30 DIAGNOSIS — Z5189 Encounter for other specified aftercare: Secondary | ICD-10-CM | POA: Diagnosis not present

## 2014-10-02 ENCOUNTER — Ambulatory Visit: Payer: PRIVATE HEALTH INSURANCE | Attending: Family Medicine | Admitting: Physical Therapy

## 2014-10-02 DIAGNOSIS — M546 Pain in thoracic spine: Secondary | ICD-10-CM | POA: Insufficient documentation

## 2014-10-02 DIAGNOSIS — R5381 Other malaise: Secondary | ICD-10-CM | POA: Insufficient documentation

## 2014-10-02 DIAGNOSIS — M542 Cervicalgia: Secondary | ICD-10-CM | POA: Insufficient documentation

## 2014-10-02 DIAGNOSIS — Z5189 Encounter for other specified aftercare: Secondary | ICD-10-CM | POA: Diagnosis present

## 2014-10-04 ENCOUNTER — Ambulatory Visit: Payer: PRIVATE HEALTH INSURANCE | Admitting: Physical Therapy

## 2014-10-04 DIAGNOSIS — Z5189 Encounter for other specified aftercare: Secondary | ICD-10-CM | POA: Diagnosis not present

## 2014-10-07 ENCOUNTER — Ambulatory Visit: Payer: PRIVATE HEALTH INSURANCE

## 2014-10-07 DIAGNOSIS — Z5189 Encounter for other specified aftercare: Secondary | ICD-10-CM | POA: Diagnosis not present

## 2014-10-09 ENCOUNTER — Ambulatory Visit: Payer: PRIVATE HEALTH INSURANCE | Admitting: Physical Therapy

## 2014-10-09 DIAGNOSIS — Z5189 Encounter for other specified aftercare: Secondary | ICD-10-CM | POA: Diagnosis not present

## 2014-10-11 ENCOUNTER — Ambulatory Visit: Payer: PRIVATE HEALTH INSURANCE | Admitting: Physical Therapy

## 2014-10-11 DIAGNOSIS — Z5189 Encounter for other specified aftercare: Secondary | ICD-10-CM | POA: Diagnosis not present

## 2014-10-14 ENCOUNTER — Ambulatory Visit: Payer: PRIVATE HEALTH INSURANCE | Admitting: Physical Therapy

## 2014-10-14 DIAGNOSIS — Z5189 Encounter for other specified aftercare: Secondary | ICD-10-CM | POA: Diagnosis not present

## 2014-10-16 ENCOUNTER — Ambulatory Visit: Payer: PRIVATE HEALTH INSURANCE

## 2014-10-16 DIAGNOSIS — Z5189 Encounter for other specified aftercare: Secondary | ICD-10-CM | POA: Diagnosis not present

## 2014-10-18 ENCOUNTER — Ambulatory Visit: Payer: PRIVATE HEALTH INSURANCE | Admitting: Physical Therapy

## 2014-10-18 DIAGNOSIS — Z5189 Encounter for other specified aftercare: Secondary | ICD-10-CM | POA: Diagnosis not present

## 2014-10-21 ENCOUNTER — Ambulatory Visit: Payer: PRIVATE HEALTH INSURANCE | Admitting: Physical Therapy

## 2014-10-21 DIAGNOSIS — Z5189 Encounter for other specified aftercare: Secondary | ICD-10-CM | POA: Diagnosis not present

## 2014-10-23 ENCOUNTER — Ambulatory Visit: Payer: PRIVATE HEALTH INSURANCE | Admitting: Physical Therapy

## 2014-10-23 DIAGNOSIS — Z5189 Encounter for other specified aftercare: Secondary | ICD-10-CM | POA: Diagnosis not present

## 2014-10-24 ENCOUNTER — Ambulatory Visit: Payer: PRIVATE HEALTH INSURANCE | Admitting: Physical Therapy

## 2014-10-28 ENCOUNTER — Ambulatory Visit: Payer: PRIVATE HEALTH INSURANCE | Admitting: Physical Therapy

## 2014-10-28 DIAGNOSIS — Z5189 Encounter for other specified aftercare: Secondary | ICD-10-CM | POA: Diagnosis not present

## 2014-10-30 ENCOUNTER — Ambulatory Visit: Payer: PRIVATE HEALTH INSURANCE

## 2014-10-30 DIAGNOSIS — Z5189 Encounter for other specified aftercare: Secondary | ICD-10-CM | POA: Diagnosis not present

## 2014-12-26 ENCOUNTER — Encounter: Payer: Self-pay | Admitting: *Deleted

## 2015-01-01 ENCOUNTER — Encounter: Payer: Self-pay | Admitting: Neurology

## 2015-01-01 ENCOUNTER — Ambulatory Visit (INDEPENDENT_AMBULATORY_CARE_PROVIDER_SITE_OTHER): Payer: PRIVATE HEALTH INSURANCE | Admitting: Neurology

## 2015-01-01 VITALS — BP 102/64 | HR 84 | Temp 98.4°F | Resp 20 | Ht 64.0 in | Wt 127.7 lb

## 2015-01-01 DIAGNOSIS — G44329 Chronic post-traumatic headache, not intractable: Secondary | ICD-10-CM

## 2015-01-01 DIAGNOSIS — G43009 Migraine without aura, not intractable, without status migrainosus: Secondary | ICD-10-CM

## 2015-01-01 DIAGNOSIS — M542 Cervicalgia: Secondary | ICD-10-CM

## 2015-01-01 MED ORDER — NORTRIPTYLINE HCL 10 MG PO CAPS: 10.0000 mg | ORAL_CAPSULE | Freq: Every day | ORAL | Status: DC

## 2015-01-01 MED ORDER — TIZANIDINE HCL 2 MG PO TABS: ORAL_TABLET | ORAL | Status: DC

## 2015-01-01 NOTE — Patient Instructions (Signed)
1.  We will start nortriptyline 10mg  at bedtime.  Call in 4 weeks with update and we can adjust dose if needed 2.  Take tizanidine 2mg  (muscle relaxant) 1 to 2 tablets when you get severe neck pain to try and prevent onset of headache. 3.  Try Aleve 500mg  at earliest onset of headache.  May repeat in 12 hours if needed.  Do not take more than 2 days out of the week 4.  Follow up in 3 months but call in 4 weeks with update.
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
## 2015-01-01 NOTE — Addendum Note (Signed)
Addended byTomi Likens, Dub Maclellan R on: 01/01/2015 11:29 AM   Modules accepted: Level of Service

## 2015-02-22 ENCOUNTER — Other Ambulatory Visit: Payer: Self-pay | Admitting: Neurology

## 2015-04-09 ENCOUNTER — Ambulatory Visit: Payer: PRIVATE HEALTH INSURANCE | Admitting: Neurology
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
## 2016-04-26 ENCOUNTER — Inpatient Hospital Stay (HOSPITAL_COMMUNITY): Admission: AD | Admit: 2016-04-26 | Payer: Self-pay | Source: Ambulatory Visit | Admitting: Obstetrics and Gynecology

## 2016-04-26 ENCOUNTER — Inpatient Hospital Stay (HOSPITAL_COMMUNITY): Payer: Managed Care, Other (non HMO)

## 2016-04-26 ENCOUNTER — Encounter (HOSPITAL_COMMUNITY): Payer: Self-pay | Admitting: *Deleted

## 2016-04-26 ENCOUNTER — Inpatient Hospital Stay (HOSPITAL_COMMUNITY)
Admission: AD | Admit: 2016-04-26 | Discharge: 2016-04-26 | Disposition: A | Discharge status: Home / Self Care | Payer: Managed Care, Other (non HMO) | Source: Ambulatory Visit | Attending: Obstetrics and Gynecology | Admitting: Obstetrics and Gynecology

## 2016-04-26 DIAGNOSIS — O034 Incomplete spontaneous abortion without complication: Secondary | ICD-10-CM | POA: Diagnosis not present

## 2016-04-26 DIAGNOSIS — O209 Hemorrhage in early pregnancy, unspecified: Secondary | ICD-10-CM

## 2016-04-26 DIAGNOSIS — R109 Unspecified abdominal pain: Secondary | ICD-10-CM | POA: Diagnosis present

## 2016-04-26 LAB — CBC
HEMATOCRIT: 36.8 % (ref 36.0–46.0)
HEMOGLOBIN: 12.6 g/dL (ref 12.0–15.0)
MCH: 30.7 pg (ref 26.0–34.0)
MCHC: 34.2 g/dL (ref 30.0–36.0)
MCV: 89.5 fL (ref 78.0–100.0)
Platelets: 233 10*3/uL (ref 150–400)
RBC: 4.11 MIL/uL (ref 3.87–5.11)
RDW: 13.1 % (ref 11.5–15.5)
WBC: 6.3 10*3/uL (ref 4.0–10.5)

## 2016-04-26 LAB — URINE MICROSCOPIC-ADD ON: BACTERIA UA: NONE SEEN

## 2016-04-26 LAB — URINALYSIS, ROUTINE W REFLEX MICROSCOPIC
BILIRUBIN URINE: NEGATIVE
Glucose, UA: NEGATIVE mg/dL
KETONES UR: NEGATIVE mg/dL
Leukocytes, UA: NEGATIVE
Nitrite: NEGATIVE
Protein, ur: NEGATIVE mg/dL
pH: 6.5 (ref 5.0–8.0)

## 2016-04-26 LAB — POCT PREGNANCY, URINE: PREG TEST UR: POSITIVE — AB

## 2016-04-26 LAB — ABO/RH: ABO/RH(D): B POS

## 2016-04-26 MED ORDER — MISOPROSTOL 200 MCG PO TABS
800.0000 ug | ORAL_TABLET | Freq: Once | ORAL | Status: AC
  Administered 2016-04-26: 800 ug via VAGINAL
  Filled 2016-04-26: qty 4

## 2016-04-26 MED ORDER — IBUPROFEN 600 MG PO TABS: 600.0000 mg | ORAL_TABLET | Freq: Four times a day (QID) | ORAL | Status: DC | PRN

## 2016-04-26 MED ORDER — IBUPROFEN 600 MG PO TABS
600.0000 mg | ORAL_TABLET | Freq: Once | ORAL | Status: AC
  Administered 2016-04-26: 600 mg via ORAL
  Filled 2016-04-26: qty 1

## 2016-04-26 MED ORDER — TRAMADOL HCL 50 MG PO TABS: 50.0000 mg | ORAL_TABLET | Freq: Four times a day (QID) | ORAL | Status: DC | PRN

## 2016-04-26 NOTE — Discharge Instructions (Signed)
Incomplete Miscarriage A miscarriage is the sudden loss of an unborn baby (fetus) before the 20th week of pregnancy. In an incomplete miscarriage, parts of the fetus or placenta (afterbirth) remain in the body.  Having a miscarriage can be an emotional experience. Talk with your health care provider about any questions you may have about miscarrying, the grieving process, and your future pregnancy plans. CAUSES   Problems with the fetal chromosomes that make it impossible for the baby to develop normally. Problems with the baby's genes or chromosomes are most often the result of errors that occur by chance as the embryo divides and grows. The problems are not inherited from the parents.  Infection of the cervix or uterus.  Hormone problems.  Problems with the cervix, such as having an incompetent cervix. This is when the tissue in the cervix is not strong enough to hold the pregnancy.  Problems with the uterus, such as an abnormally shaped uterus, uterine fibroids, or congenital abnormalities.  Certain medical conditions.  Smoking, drinking alcohol, or taking illegal drugs.  Trauma. SYMPTOMS   Vaginal bleeding or spotting, with or without cramps or pain.  Pain or cramping in the abdomen or lower back.  Passing fluid, tissue, or blood clots from the vagina. DIAGNOSIS  Your health care provider will perform a physical exam. You may also have an ultrasound to confirm the miscarriage. Blood or urine tests may also be ordered. TREATMENT   Usually, a dilation and curettage (D&C) procedure is performed. During a D&C procedure, the cervix is widened (dilated) and any remaining fetal or placental tissue is gently removed from the uterus.  Antibiotic medicines are prescribed if there is an infection. Other medicines may be given to reduce the size of the uterus (contract) if there is a lot of bleeding.  If you have Rh negative blood and your baby was Rh positive, you will need a Rho (D)  immune globulin shot. This shot will protect any future baby from having Rh blood problems in future pregnancies.  You may be confined to bed rest. This means you should stay in bed and only get up to use the bathroom. HOME CARE INSTRUCTIONS   Rest as directed by your health care provider.  Restrict activity as directed by your health care provider. You may be allowed to continue light activity if curettage was not done but you require further treatment.  Keep track of the number of pads you use each day. Keep track of how soaked (saturated) they are. Record this information.  Do not  use tampons.  Do not douche or have sexual intercourse until approved by your health care provider.  Keep all follow-up appointments for reevaluation and continuing management.  Only take over-the-counter or prescription medicines for pain, fever, or discomfort as directed by your health care provider.  Take antibiotic medicine as directed by your health care provider. Make sure you finish it even if you start to feel better. SEEK IMMEDIATE MEDICAL CARE IF:   You experience severe cramps in your stomach, back, or abdomen.  You have an unexplained temperature (make sure to record these temperatures).  You pass large clots or tissue (save these for your health care provider to inspect).  Your bleeding increases.  You become light-headed, weak, or have fainting episodes. MAKE SURE YOU:   Understand these instructions.  Will watch your condition.  Will get help right away if you are not doing well or get worse.   This information is not intended to   replace advice given to you by your health care provider. Make sure you discuss any questions you have with your health care provider.   Document Released: 10/18/2005 Document Revised: 11/08/2014 Document Reviewed: 05/17/2013 Elsevier Interactive Patient Education 2016 [employer]  

## 2016-04-26 NOTE — MAU Provider Note (Signed)
History     CSN: [id number]  Arrival date and time: 04/26/16 1055   First Provider Initiated Contact with Patient 04/26/16 1155      Chief Complaint  Patient presents with  . Vaginal Bleeding  . Abdominal Pain   HPI   Ms.Janet Allen is a 25 y.o. female G2P0010 @ [redacted]w[redacted]d here in MAU with abdominal pain and vaginal bleeding. The patient is receiving her care with Dr. Melba Coon at [facility]. She has been seen in the office for this pregnancy and had an [place] @ 6 weeks which showed fetal cardiac activity. The [place] discharge started 2 weeks ago, it only lasted 1 day and the patient did not think anything of it. She was not having pain at the time.  Over the weekend she noted the brown/dark red discharge again and it was heavier than the first episode. She is currently using vaginal progesterone at night time.   OB History    Gravida Para Term Preterm AB TAB SAB Ectopic Multiple Living   2 0 0 0 1 0 1 0 0 0       Past Medical History  Diagnosis Date  . Allergy   . Asthma   . Heart murmur   . Pneumonia     hx  . History of recurrent UTIs   . Tinnitus   . Vitamin D deficiency   . Headache   . Vertigo     Past Surgical History  Procedure Laterality Date  . Hernia repair  1990  . Tympanostomy tube placement  1991  . Wisdom tooth extraction  2004  . Mass excision Left 03/15/2013    Procedure: EXCISION OF TWO LEFT BREAST MASSES;  Surgeon: Stark Klein, MD;  Location: [place];  Service: General;  Laterality: Left;  . Breast biopsy Left 09/20/2013    Procedure: BREAST mass WITH NEEDLE LOCALIZATION;  Surgeon: Stark Klein, MD;  Location: [place];  Service: General;  Laterality: Left;  needle loc BCG 7:30  . Breast biopsy Right 09/20/2013    Procedure: BREAST mass;  Surgeon: Stark Klein, MD;  Location: [place];  Service: General;  Laterality: Right;    Family History  Problem Relation Age of Onset  . Hypertension Mother   . Depression Mother   . Anxiety disorder Sister   .  Fibrocystic breast disease Maternal Grandmother   . Hypertension Paternal Grandmother   . Cancer Paternal Grandmother     stomach    Social History  Substance Use Topics  . Smoking status: Never Smoker   . Smokeless tobacco: Never Used  . Alcohol Use: 0.0 oz/week    0 Standard drinks or equivalent per week    Allergies: No Known Allergies  Prescriptions prior to admission  Medication Sig Dispense Refill Last Dose  . levocetirizine (XYZAL) 5 MG tablet Take 5 mg by mouth every evening.   04/25/2016 at Unknown time  . mometasone-formoterol (DULERA) 200-5 MCG/ACT AERO Inhale 2 puffs into the lungs 2 (two) times daily.   04/26/2016 at Unknown time  . montelukast (SINGULAIR) 10 MG tablet Take 10 mg by mouth at bedtime.   04/25/2016 at Unknown time  . Prenatal Vit-Fe Fumarate-FA (PRENATAL MULTIVITAMIN) TABS tablet Take 1 tablet by mouth daily at 12 noon.   04/25/2016 at Unknown time  . progesterone (PROMETRIUM) 200 MG capsule Take 200 mg by mouth daily.   04/25/2016 at Unknown time  . albuterol (PROVENTIL HFA;VENTOLIN HFA) 108 (90 BASE) MCG/ACT inhaler Inhale 2 puffs into the lungs  daily as needed for wheezing or shortness of breath.   rescue  . nortriptyline (PAMELOR) 10 MG capsule TAKE 1 CAPSULE (10 MG TOTAL) BY MOUTH AT BEDTIME. (Patient not taking: Reported on 04/26/2016) 30 capsule 0 Not Taking at Unknown time  . tiZANidine (ZANAFLEX) 2 MG tablet Take 1-2 tabs at onset of neck pain.  Take every 8 hours as needed. (Patient not taking: Reported on 04/26/2016) 30 tablet 0 Not Taking at Unknown time   Results for orders placed or performed during the hospital encounter of 04/26/16 (from the past 48 hour(s))  Urinalysis, Routine w reflex microscopic (not at [facility])     Status: Abnormal   Collection Time: 04/26/16 11:13 AM  Result Value Ref Range   Color, Urine YELLOW YELLOW   APPearance CLEAR CLEAR   Specific Gravity, Urine <1.005 (L) 1.005 - 1.030   pH 6.5 5.0 - 8.0   Glucose, UA NEGATIVE  NEGATIVE mg/dL   Hgb urine dipstick LARGE (A) NEGATIVE   Bilirubin Urine NEGATIVE NEGATIVE   Ketones, ur NEGATIVE NEGATIVE mg/dL   Protein, ur NEGATIVE NEGATIVE mg/dL   Nitrite NEGATIVE NEGATIVE   Leukocytes, UA NEGATIVE NEGATIVE  Urine microscopic-add on     Status: Abnormal   Collection Time: 04/26/16 11:13 AM  Result Value Ref Range   Squamous Epithelial / LPF 0-5 (A) NONE SEEN   WBC, UA 0-5 0 - 5 WBC/hpf   RBC / HPF 0-5 0 - 5 RBC/hpf   Bacteria, UA NONE SEEN NONE SEEN  Pregnancy, urine POC     Status: Abnormal   Collection Time: 04/26/16 11:19 AM  Result Value Ref Range   Preg Test, Ur POSITIVE (A) NEGATIVE    Comment:        THE SENSITIVITY OF THIS METHODOLOGY IS >24 mIU/mL   CBC     Status: None   Collection Time: 04/26/16 12:17 PM  Result Value Ref Range   WBC 6.3 4.0 - 10.5 K/uL   RBC 4.11 3.87 - 5.11 MIL/uL   Hemoglobin 12.6 12.0 - 15.0 g/dL   HCT 36.8 36.0 - 46.0 %   MCV 89.5 78.0 - 100.0 fL   MCH 30.7 26.0 - 34.0 pg   MCHC 34.2 30.0 - 36.0 g/dL   RDW 13.1 11.5 - 15.5 %   Platelets 233 150 - 400 K/uL  ABO/Rh     Status: None   Collection Time: 04/26/16 12:17 PM  Result Value Ref Range   ABO/RH(D) B POS    US Ob Comp Less 14 Wks  04/26/2016  CLINICAL DATA:  Vaginal bleeding.  Pregnancy. EXAM: OBSTETRIC <14 WK [place] AND TRANSVAGINAL OB US TECHNIQUE: Both transabdominal and transvaginal ultrasound examinations were performed for complete evaluation of the gestation as well as the maternal uterus, adnexal regions, and pelvic cul-de-sac. Transvaginal technique was performed to assess early pregnancy. COMPARISON:  No recent prior. FINDINGS: Intrauterine gestational sac: Single Yolk sac:  Present Embryo:  Present Cardiac Activity: Not visualized CRL:  1.0 cm 7 w   1 d                  [place] EDC: 12/13/2015 Subchorionic hemorrhage:  Not visualized Maternal uterus/adnexae: A 2.2 and 3.4 cm simple cyst is noted in the right ovary. These can be followed on subsequent exam to  demonstrate resolution. IMPRESSION: 1. 7 week 1 day intrauterine pregnancy. No cardiac activity noted at this time. Close follow-up pelvic ultrasound and beta HCGs recommended to demonstrate fetal viability. 2. A 2.43.4  cm simple cyst is in the right ovary. These can be followed on subsequent exams to demonstrate resolution . Electronically Signed   By: Reading   On: 04/26/2016 14:24   US Ob Transvaginal  04/26/2016  CLINICAL DATA:  Vaginal bleeding.  Pregnancy. EXAM: OBSTETRIC <14 WK [place] AND TRANSVAGINAL OB US TECHNIQUE: Both transabdominal and transvaginal ultrasound examinations were performed for complete evaluation of the gestation as well as the maternal uterus, adnexal regions, and pelvic cul-de-sac. Transvaginal technique was performed to assess early pregnancy. COMPARISON:  No recent prior. FINDINGS: Intrauterine gestational sac: Single Yolk sac:  Present Embryo:  Present Cardiac Activity: Not visualized CRL:  1.0 cm 7 w   1 d                  [place] EDC: 12/13/2015 Subchorionic hemorrhage:  Not visualized Maternal uterus/adnexae: A 2.2 and 3.4 cm simple cyst is noted in the right ovary. These can be followed on subsequent exam to demonstrate resolution. IMPRESSION: 1. 7 week 1 day intrauterine pregnancy. No cardiac activity noted at this time. Close follow-up pelvic ultrasound and beta HCGs recommended to demonstrate fetal viability. 2. A 2.43.4 cm simple cyst is in the right ovary. These can be followed on subsequent exams to demonstrate resolution . Electronically Signed   By: Marcello Moores  Register   On: 04/26/2016 14:24    Review of Systems  Constitutional: Negative for fever.  Gastrointestinal: Positive for abdominal pain (sharp pain X one time only near umbilicus ).  Genitourinary: Negative for dysuria.   Physical Exam   Blood pressure 116/62, pulse 71, temperature 98.5 F (36.9 C), temperature source Oral, resp. rate 18, height 5\' 4"  (1.626 m), weight 123 lb (55.792 kg).  Physical Exam   Constitutional: She is oriented to person, place, and time. She appears well-developed and well-nourished. No distress.  HENT:  Head: Normocephalic.  Eyes: Pupils are equal, round, and reactive to light.  Respiratory: Effort normal.  GI: Soft. She exhibits no distension. There is no tenderness. There is no rebound.  Genitourinary:  Speculum exam: Vagina - Small-moderate amount of dark red blood in the vagina, no odor Cervix - + active bleeding, small amount  Bimanual exam: Cervix closed Uterus non tender, normal size Adnexa non tender, no masses bilaterally Chaperone present for exam.  Musculoskeletal: Normal range of motion.  Neurological: She is alert and oriented to person, place, and time.  Skin: Skin is warm. She is not diaphoretic.  Psychiatric: Her behavior is normal.    MAU Course  Procedures   Early Intrauterine Pregnancy Failure Protocol X  Documented intrauterine pregnancy failure less than or equal to [redacted] weeks   gestation  X  No serious current illness  X  Baseline Hgb greater than or equal to 10g/dl  X  Patient has easily accessible transportation to the hospital  X  Clear preference  X  Practitioner/physician deems patient reliable  X  Counseling by practitioner or physician  X  Patient education by RN  X  Consent form signed 04/26/2016 NA     Rho-Gam given by RN if indicated  X  Medication dispensed  X  Cytotec 800 mcg Intravaginally by Noni Saupe NP  In MAU X   Ibuprofen 600 mg 1 tablet by mouth every 6 hours as needed #30 - prescribed  X   Ultram by mouth every 4 to 6 hours as needed - prescribed    Reviewed with pt cytotec procedure.  Pt verbalizes that she lives  close to the hospital and has transportation readily available.  Pt appears reliable and verbalizes understanding and agrees with plan of care.  MDM  Discussed patient with Dr. Melba Coon, confirmed fetal heart rate on [place] in the office per Dr. Melba Coon. Absent fetal heart tones today on [place].  Patient had irregular rise in beta hcg leve CBC Ibuprofen 600 mg PO given in MAU.  B positive blood type confirmed   Assessment and Plan    A:  1. Incomplete miscarriage   2. Vaginal bleeding in pregnancy, first trimester     P:  Discharge home in stable condition Contact  OBGYN in the next 48 hours for follow up in the office Bleeding precautions Pelvic rest Return to MAU if symptoms worsen Support given  Lezlie Lye, NP 04/26/2016 6:22 PM

## 2016-04-26 NOTE — MAU Note (Signed)
Pt went to [facility] this morning, noticed "quite a bit of blood."  Taking progesterone vaginally.  Already established with [facility].  Lower abd pain started on the way here.

## 2016-04-27 ENCOUNTER — Encounter (HOSPITAL_COMMUNITY): Payer: Self-pay

## 2016-04-27 LAB — PROGESTERONE: Progesterone: 13.2 ng/mL

## 2016-12-20 ENCOUNTER — Encounter: Payer: Self-pay | Admitting: Endocrinology

## 2016-12-20 ENCOUNTER — Ambulatory Visit (INDEPENDENT_AMBULATORY_CARE_PROVIDER_SITE_OTHER): Payer: Managed Care, Other (non HMO) | Admitting: Endocrinology

## 2016-12-20 VITALS — BP 108/70 | HR 79 | Ht 64.0 in | Wt 126.0 lb

## 2016-12-20 DIAGNOSIS — E221 Hyperprolactinemia: Secondary | ICD-10-CM | POA: Diagnosis not present

## 2016-12-20 DIAGNOSIS — E038 Other specified hypothyroidism: Secondary | ICD-10-CM

## 2016-12-20 DIAGNOSIS — E063 Autoimmune thyroiditis: Secondary | ICD-10-CM
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
## 2017-01-14 ENCOUNTER — Other Ambulatory Visit (INDEPENDENT_AMBULATORY_CARE_PROVIDER_SITE_OTHER): Payer: Managed Care, Other (non HMO)

## 2017-01-14 DIAGNOSIS — E063 Autoimmune thyroiditis: Secondary | ICD-10-CM

## 2017-01-14 DIAGNOSIS — E221 Hyperprolactinemia: Secondary | ICD-10-CM

## 2017-01-14 DIAGNOSIS — E038 Other specified hypothyroidism: Secondary | ICD-10-CM | POA: Diagnosis not present

## 2017-01-14 LAB — T4, FREE: FREE T4: 0.87 ng/dL (ref 0.60–1.60)

## 2017-01-14 LAB — TSH: TSH: 2.07 u[IU]/mL (ref 0.35–4.50)

## 2017-01-15 LAB — PROLACTIN: Prolactin: 1.3 ng/mL — ABNORMAL LOW (ref 4.8–23.3)

## 2017-01-19 ENCOUNTER — Encounter: Payer: Self-pay | Admitting: Endocrinology

## 2017-01-19 ENCOUNTER — Ambulatory Visit (INDEPENDENT_AMBULATORY_CARE_PROVIDER_SITE_OTHER): Payer: Managed Care, Other (non HMO) | Admitting: Endocrinology

## 2017-01-19 VITALS — BP 120/74 | HR 67 | Ht 64.0 in | Wt 125.0 lb

## 2017-01-19 DIAGNOSIS — E038 Other specified hypothyroidism: Secondary | ICD-10-CM

## 2017-01-19 DIAGNOSIS — E063 Autoimmune thyroiditis: Secondary | ICD-10-CM

## 2017-01-19 MED ORDER — LEVOTHYROXINE SODIUM 50 MCG PO TABS: 50.0000 ug | ORAL_TABLET | Freq: Every day | ORAL | 1 refills | Status: DC

## 2017-01-19 NOTE — Progress Notes (Signed)
Patient ID: Janet Allen, female   DOB: 1991-01-02, 26 y.o.   MRN: [id number]           Referring Physician: Jonathon Jordan  Reason for Appointment:  Hypothyroidism, new visit    History of Present Illness:   Hypothyroidism was first diagnosed in 11/2016  Patient ha s had fatigue for a couple of years which has been fairly consistent. She did not have any new onset of fatigue or worsening of his symptoms recently She said that she was having an  annual exam with her primary care physician in 1/18 and she was tested for multiple conditions because of her history of miscarriages. She says she has had tendency to cold intolerance for a few years and this is not new No history of hair loss or constipation  Since the TSH was significantly high patient was started on levothyroxine 50 g daily last month With this she thinks her energy level is improving although not back to normal She does not feel any other changes with taking the medication, still has tendency to feeling cold Her weight which had previously increased is about the same Also she does not feel any shakiness or palpitations with taking the thyroid supplement          Patient's weight history is as follows:  Wt Readings from Last 3 Encounters:  01/19/17 125 lb (56.7 kg)  12/20/16 126 lb (57.2 kg)  04/26/16 123 lb (55.8 kg)    Thyroid function results have been as follows:  TSH 23.7 done on 11/25/16  Lab Results  Component Value Date   FREET4 0.87 01/14/2017   TSH 2.07 01/14/2017   TSH 1.412 12/10/2012   TSH 1.467 06/18/2012    PROBLEM 2:   HIGH prolactin level Baseline prolactin level was 51.1  in August 2017 This may have been checked because of her menstrual cycles being lighter.  She was started on cabergoline twice a week initially by her gynecologist. Subsequent prolactin level on 11/25/16 was 1.30. The previous result was 0.7 in October 2017 and 51.1  She was told to stop her cabergoline on her  initial consultation and she says her menstrual cycles are still unchanged, last one was early Prolactin level is still low    Past Medical History:  Diagnosis Date  . Allergy   . Asthma   . Headache   . Heart murmur   . History of recurrent UTIs   . Pneumonia    hx  . Tinnitus   . Vertigo   . Vitamin D deficiency     Past Surgical History:  Procedure Laterality Date  . BREAST BIOPSY Left 09/20/2013   Procedure: BREAST mass WITH NEEDLE LOCALIZATION;  Surgeon: Stark Klein, MD;  Location: [place];  Service: General;  Laterality: Left;  needle loc BCG 7:30  . BREAST BIOPSY Right 09/20/2013   Procedure: BREAST mass;  Surgeon: Stark Klein, MD;  Location: [place];  Service: General;  Laterality: Right;  . HERNIA REPAIR  1990  . MASS EXCISION Left 03/15/2013   Procedure: EXCISION OF TWO LEFT BREAST MASSES;  Surgeon: Stark Klein, MD;  Location: [place];  Service: General;  Laterality: Left;  . TYMPANOSTOMY TUBE PLACEMENT  1991  . WISDOM TOOTH EXTRACTION  2004    Family History  Problem Relation Age of Onset  . Hypertension Mother   . Depression Mother   . Anxiety disorder Sister   . Fibrocystic breast disease Maternal Grandmother   . Hypertension Paternal Grandmother   .  Cancer Paternal Grandmother     stomach    Social History:  reports that she has never smoked. She has never used smokeless tobacco. She reports that she drinks alcohol. She reports that she does not use drugs.  Allergies: No Known Allergies  Allergies as of 01/19/2017   No Known Allergies     Medication List       Accurate as of 01/19/17  8:39 AM. Always use your most recent med list.          albuterol 108 (90 Base) MCG/ACT inhaler Commonly known as:  PROVENTIL HFA;VENTOLIN HFA Inhale 2 puffs into the lungs daily as needed for wheezing or shortness of breath.   cabergoline 0.5 MG tablet Commonly known as:  DOSTINEX TAKE 1 TABLET BY MOUTH ONCE A WEEK   DULERA 200-5 MCG/ACT Aero Generic drug:   mometasone-formoterol Inhale 2 puffs into the lungs 2 (two) times daily.   ibuprofen 600 MG tablet Commonly known as:  ADVIL,MOTRIN Take 1 tablet (600 mg total) by mouth every 6 (six) hours as needed.   levocetirizine 5 MG tablet Commonly known as:  XYZAL Take 5 mg by mouth every evening.   levothyroxine 50 MCG tablet Commonly known as:  SYNTHROID, LEVOTHROID TAKE 1 TABLET BY MOUTH DAILY   montelukast 10 MG tablet Commonly known as:  SINGULAIR Take 10 mg by mouth at bedtime.   prenatal multivitamin Tabs tablet Take 1 tablet by mouth daily at 12 noon.   progesterone 200 MG capsule Commonly known as:  PROMETRIUM Take 200 mg by mouth daily.   traMADol 50 MG tablet Commonly known as:  ULTRAM Take 1 tablet (50 mg total) by mouth every 6 (six) hours as needed.          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 TSH is back to normal  HYPERPROLACTINEMIA:  Even with stopping cabergoline her prolactin level list still suppressed indicating it is unlikely that she had any significant pituitary abnormality Menstrual cycles are unchanged  PLAN:   She will continue 50 g of levothyroxine She will need to have follow-up TSH done in about 4 months but she can call if she starts having more fatigue before that  If she has any recurrence of oligomenorrhea with can retest her prolactin   [facility] 01/19/2017, 8:39 AM   Consultation note copy sent to the PCP  Note: This office note was prepared with Dragon voice recognition system technology. Any transcriptional errors that result from this process  are unintentional.

## 2017-02-15 ENCOUNTER — Telehealth: Payer: Self-pay | Admitting: Endocrinology

## 2017-02-15 ENCOUNTER — Encounter: Payer: Self-pay | Admitting: Endocrinology

## 2017-02-15 NOTE — Telephone Encounter (Signed)
No, need to get report of her x-ray from orthopedist

## 2017-02-15 NOTE — Telephone Encounter (Signed)
This has nothing to do with the thyroid, thyroid will not show up on x-ray

## 2017-02-15 NOTE — Telephone Encounter (Signed)
Pt was at the Orthopedic doctor yesterday did an X Ray of her neck and said that the PA there noticed a 'bright' area in her neck, she told to to contact us to see about possibly having an Ultrasound done. She said that you may leave a VM on her cell. She was seen at [facility].

## 2017-02-15 NOTE — Telephone Encounter (Signed)
Pt is still inquiring about the [place] should she have one done?

## 2017-02-16 ENCOUNTER — Other Ambulatory Visit: Payer: Self-pay | Admitting: Endocrinology

## 2017-02-16 DIAGNOSIS — M542 Cervicalgia: Secondary | ICD-10-CM

## 2017-02-16 DIAGNOSIS — E063 Autoimmune thyroiditis: Secondary | ICD-10-CM

## 2017-02-16 NOTE — Telephone Encounter (Signed)
Xray is under media in the pt's email to [place]

## 2017-02-17 NOTE — Telephone Encounter (Signed)
Patient communicated via email and ultrasound ordered

## 2017-03-01 ENCOUNTER — Ambulatory Visit
Admission: RE | Admit: 2017-03-01 | Discharge: 2017-03-01 | Disposition: A | Discharge status: Home / Self Care | Payer: Managed Care, Other (non HMO) | Source: Ambulatory Visit | Attending: Endocrinology | Admitting: Endocrinology

## 2017-03-01 DIAGNOSIS — E063 Autoimmune thyroiditis: Secondary | ICD-10-CM

## 2017-03-01 DIAGNOSIS — M542 Cervicalgia: Secondary | ICD-10-CM

## 2017-03-01 NOTE — Progress Notes (Signed)
Please call to let patient know that the ultrasound results are compatible with Hashimoto's thyroiditis and no further action needed

## 2017-05-17 ENCOUNTER — Other Ambulatory Visit (INDEPENDENT_AMBULATORY_CARE_PROVIDER_SITE_OTHER): Payer: Managed Care, Other (non HMO)

## 2017-05-17 DIAGNOSIS — E063 Autoimmune thyroiditis: Secondary | ICD-10-CM

## 2017-05-17 LAB — T4, FREE: FREE T4: 0.93 ng/dL (ref 0.60–1.60)

## 2017-05-17 LAB — TSH: TSH: 0.8 u[IU]/mL (ref 0.35–4.50)

## 2017-05-19 NOTE — Progress Notes (Signed)
Patient ID: Janet Allen, female   DOB: Jun 12, 1991, 26 y.o.   MRN: [id number]           Referring Physician: Jonathon Jordan  Reason for Appointment:  Hypothyroidism, follow-up visit    History of Present Illness:   Hypothyroidism was first diagnosed in 11/2016  Patient had fatigue for a couple of years which has been fairly consistent. She said that she was having an  annual exam with her primary care physician in 1/18 and she was tested for multiple conditions because of her history of miscarriages. She says she has had tendency to cold intolerance for a few years also; no hair loss for constipation  Since the TSH was significantly high at 24 patient was started on levothyroxine 50 g daily  With this her energy level had improved Her dose was continued unchanged on her last follow-up in March  She is now coming back for follow-up Apparently she is now [redacted] weeks pregnant and she had some labs done with her gynecologist 10-14 days ago She was told to take extra tablets of Synthroid on weekends, results are not available Currently she does feel fatigued because of her pregnancy No significant recent weight change She does think she feels a little more anxious but not shaky now At times will still feel cold          Patient's weight history is as follows:  Wt Readings from Last 3 Encounters:  05/20/17 126 lb 3.2 oz (57.2 kg)  01/19/17 125 lb (56.7 kg)  12/20/16 126 lb (57.2 kg)    Thyroid function results have been as follows:  TSH 23.7 done on 11/25/16  Lab Results  Component Value Date   FREET4 0.93 05/17/2017   FREET4 0.87 01/14/2017   TSH 0.80 05/17/2017   TSH 2.07 01/14/2017   TSH 1.412 12/10/2012    PROBLEM 2:   HIGH prolactin level Baseline prolactin level was 51.1  in August 2017 This may have been checked because of her menstrual cycles being lighter.  She was started on cabergoline twice a week initially by her gynecologist. Subsequent prolactin  level on 11/25/16 was 1.30. The previous result was 0.7 in October 2017 and 51.1  She was told to stop her cabergoline on her initial consultation and she says her menstrual cycles are still unchanged, last one was early Prolactin level is still low    Past Medical History:  Diagnosis Date  . Allergy   . Asthma   . Headache   . Heart murmur   . History of recurrent UTIs   . Pneumonia    hx  . Tinnitus   . Vertigo   . Vitamin D deficiency     Past Surgical History:  Procedure Laterality Date  . BREAST BIOPSY Left 09/20/2013   Procedure: BREAST mass WITH NEEDLE LOCALIZATION;  Surgeon: Stark Klein, MD;  Location: [place];  Service: General;  Laterality: Left;  needle loc BCG 7:30  . BREAST BIOPSY Right 09/20/2013   Procedure: BREAST mass;  Surgeon: Stark Klein, MD;  Location: [place];  Service: General;  Laterality: Right;  . HERNIA REPAIR  1990  . MASS EXCISION Left 03/15/2013   Procedure: EXCISION OF TWO LEFT BREAST MASSES;  Surgeon: Stark Klein, MD;  Location: [place];  Service: General;  Laterality: Left;  . TYMPANOSTOMY TUBE PLACEMENT  1991  . WISDOM TOOTH EXTRACTION  2004    Family History  Problem Relation Age of Onset  . Hypertension Mother   .  Depression Mother   . Anxiety disorder Sister   . Fibrocystic breast disease Maternal Grandmother   . Hypertension Paternal Grandmother   . Cancer Paternal Grandmother        stomach    Social History:  reports that she has never smoked. She has never used smokeless tobacco. She reports that she drinks alcohol. She reports that she does not use drugs.  Allergies: No Known Allergies  Allergies as of 05/20/2017   No Known Allergies     Medication List       Accurate as of 05/20/17  8:45 AM. Always use your most recent med list.          albuterol 108 (90 Base) MCG/ACT inhaler Commonly known as:  PROVENTIL HFA;VENTOLIN HFA Inhale 2 puffs into the lungs daily as needed for wheezing or shortness of breath.   DULERA  200-5 MCG/ACT Aero Generic drug:  mometasone-formoterol Inhale 2 puffs into the lungs 2 (two) times daily.   Ginger 500 MG Caps Take 1 capsule by mouth as needed.   levocetirizine 5 MG tablet Commonly known as:  XYZAL Take 5 mg by mouth every evening.   levothyroxine 50 MCG tablet Commonly known as:  SYNTHROID, LEVOTHROID Take 1 tablet (50 mcg total) by mouth daily.   montelukast 10 MG tablet Commonly known as:  SINGULAIR Take 10 mg by mouth at bedtime.   ondansetron 4 MG tablet Commonly known as:  ZOFRAN Take 4 mg by mouth every 8 (eight) hours as needed for nausea or vomiting.   prenatal multivitamin Tabs tablet Take 1 tablet by mouth daily at 12 noon.          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, Baseline TSH 23.7  Although she was doing well with 50 g levothyroxine she is now taking a higher dose for the last 10-14 days as prescribed by her gynecologist but no results of labs available from prior to the change TSH is now 0.9 She is [redacted] weeks pregnant and difficult to assess subjectively how she is doing  PLAN:   Since her TSH is now 0.9 she will take only 1 extra tablet of levothyroxine per week instead of 2 She will need to have follow-up TSH done in about 6 weeks again Discussed needing to adjust her levothyroxine dose fairly regularly during her pregnancy Will need to get labs asked from her gynecologist office to review  Total visit time for evaluation and management of hypothyroidism, labs, medication = 15 minutes  Arian Mcquitty 05/20/2017, 8:45 AM    Note: This office note was prepared with Dragon voice recognition system technology. Any transcriptional errors that  result from this process are unintentional.

## 2017-05-20 ENCOUNTER — Ambulatory Visit (INDEPENDENT_AMBULATORY_CARE_PROVIDER_SITE_OTHER): Payer: Managed Care, Other (non HMO) | Admitting: Endocrinology

## 2017-05-20 ENCOUNTER — Encounter: Payer: Self-pay | Admitting: Endocrinology

## 2017-05-20 VITALS — BP 120/74 | HR 82 | Ht 64.0 in | Wt 126.2 lb

## 2017-05-20 DIAGNOSIS — E063 Autoimmune thyroiditis: Secondary | ICD-10-CM

## 2017-05-20 NOTE — Patient Instructions (Signed)
Take 1 extra pill weekly 

## 2017-05-28 ENCOUNTER — Inpatient Hospital Stay (HOSPITAL_COMMUNITY)
Admission: AD | Admit: 2017-05-28 | Discharge: 2017-05-28 | Disposition: A | Discharge status: Home / Self Care | Payer: Managed Care, Other (non HMO) | Source: Ambulatory Visit | Attending: Obstetrics and Gynecology | Admitting: Obstetrics and Gynecology

## 2017-05-28 ENCOUNTER — Inpatient Hospital Stay (HOSPITAL_COMMUNITY): Payer: Managed Care, Other (non HMO)

## 2017-05-28 ENCOUNTER — Encounter (HOSPITAL_COMMUNITY): Payer: Self-pay | Admitting: *Deleted

## 2017-05-28 DIAGNOSIS — Z79899 Other long term (current) drug therapy: Secondary | ICD-10-CM | POA: Diagnosis not present

## 2017-05-28 DIAGNOSIS — Z87440 Personal history of urinary (tract) infections: Secondary | ICD-10-CM | POA: Insufficient documentation

## 2017-05-28 DIAGNOSIS — O209 Hemorrhage in early pregnancy, unspecified: Secondary | ICD-10-CM

## 2017-05-28 DIAGNOSIS — O99512 Diseases of the respiratory system complicating pregnancy, second trimester: Secondary | ICD-10-CM | POA: Diagnosis not present

## 2017-05-28 DIAGNOSIS — O99282 Endocrine, nutritional and metabolic diseases complicating pregnancy, second trimester: Secondary | ICD-10-CM | POA: Insufficient documentation

## 2017-05-28 DIAGNOSIS — O26892 Other specified pregnancy related conditions, second trimester: Secondary | ICD-10-CM | POA: Insufficient documentation

## 2017-05-28 DIAGNOSIS — M545 Low back pain: Secondary | ICD-10-CM | POA: Diagnosis not present

## 2017-05-28 DIAGNOSIS — E0590 Thyrotoxicosis, unspecified without thyrotoxic crisis or storm: Secondary | ICD-10-CM | POA: Insufficient documentation

## 2017-05-28 DIAGNOSIS — Z3A01 Less than 8 weeks gestation of pregnancy: Secondary | ICD-10-CM | POA: Insufficient documentation

## 2017-05-28 DIAGNOSIS — N83201 Unspecified ovarian cyst, right side: Secondary | ICD-10-CM | POA: Insufficient documentation

## 2017-05-28 DIAGNOSIS — O200 Threatened abortion: Secondary | ICD-10-CM | POA: Diagnosis not present

## 2017-05-28 DIAGNOSIS — O3482 Maternal care for other abnormalities of pelvic organs, second trimester: Secondary | ICD-10-CM | POA: Diagnosis not present

## 2017-05-28 DIAGNOSIS — J45909 Unspecified asthma, uncomplicated: Secondary | ICD-10-CM | POA: Diagnosis not present

## 2017-05-28 DIAGNOSIS — Z3491 Encounter for supervision of normal pregnancy, unspecified, first trimester: Secondary | ICD-10-CM

## 2017-05-28 HISTORY — DX: Other specified postprocedural states: Z98.890

## 2017-05-28 HISTORY — DX: Thyrotoxicosis, unspecified without thyrotoxic crisis or storm: E05.90

## 2017-05-28 HISTORY — DX: Other specified postprocedural states: R11.2

## 2017-05-28 LAB — CBC
HCT: 34.3 % — ABNORMAL LOW (ref 36.0–46.0)
Hemoglobin: 12 g/dL (ref 12.0–15.0)
MCH: 31.3 pg (ref 26.0–34.0)
MCHC: 35 g/dL (ref 30.0–36.0)
MCV: 89.6 fL (ref 78.0–100.0)
PLATELETS: 211 10*3/uL (ref 150–400)
RBC: 3.83 MIL/uL — AB (ref 3.87–5.11)
RDW: 13 % (ref 11.5–15.5)
WBC: 8.3 10*3/uL (ref 4.0–10.5)

## 2017-05-28 LAB — URINALYSIS, ROUTINE W REFLEX MICROSCOPIC
Bilirubin Urine: NEGATIVE
Glucose, UA: NEGATIVE mg/dL
KETONES UR: NEGATIVE mg/dL
Nitrite: NEGATIVE
PH: 6 (ref 5.0–8.0)
Protein, ur: NEGATIVE mg/dL
SPECIFIC GRAVITY, URINE: 1.003 — AB (ref 1.005–1.030)

## 2017-05-28 LAB — WET PREP, GENITAL
CLUE CELLS WET PREP: NONE SEEN
Sperm: NONE SEEN
TRICH WET PREP: NONE SEEN
Yeast Wet Prep HPF POC: NONE SEEN

## 2017-05-28 LAB — HCG, QUANTITATIVE, PREGNANCY: hCG, Beta Chain, Quant, S: 230334 m[IU]/mL — ABNORMAL HIGH (ref <5)

## 2017-05-28 LAB — POCT PREGNANCY, URINE: Preg Test, Ur: POSITIVE — AB

## 2017-05-28 NOTE — Discharge Instructions (Signed)

## 2017-05-28 NOTE — MAU Note (Signed)
Vaginal bleeding started today @ 1100.  Low back pain started about 1 hour ago.  Low pelvic pressure.  Bright red vaginal bleeding when she wipes.

## 2017-05-28 NOTE — MAU Provider Note (Signed)
History     CSN: [id number]  Arrival date and time: 05/28/17 1312  First Provider Initiated Contact with Patient 05/28/17 1406      Chief Complaint  Patient presents with  . Vaginal Bleeding  . Back Pain   HPI Janet Allen is a 26 y.o. G4P0030 at [redacted]w[redacted]d who presents with back pain & vaginal bleeding. Symptoms began this morning at 11 am while she was at work. Noticed bright red blood on toilet paper. Not bleeding into pad & not passing clots. Endorses constant low back pain that feels like aching. Rates pain 3/10. Has not treated. Some nausea & vomiting with pregnancy; no vomiting today. Denies recent intercourse, abdominal pain, vaginal discharge, dysuria, diarrhea, or constipation. Had ultrasound at Dr. Charlett Lango office yesterday that showed IUP with cardiac activity & confirmed EDD of 01/08/18. Care is being transferred to [place] in [facility].   OB History    Gravida Para Term Preterm AB Living   4 0 0 0 3 0   SAB TAB Ectopic Multiple Live Births   3 0 0 0        Past Medical History:  Diagnosis Date  . Allergy   . Asthma   . Headache   . Heart murmur   . History of recurrent UTIs   . Hyperthyroidism   . Pneumonia    hx  . PONV (postoperative nausea and vomiting)   . Tinnitus   . Vertigo   . Vitamin D deficiency     Past Surgical History:  Procedure Laterality Date  . BREAST BIOPSY Left 09/20/2013   Procedure: BREAST mass WITH NEEDLE LOCALIZATION;  Surgeon: Stark Klein, MD;  Location: [place];  Service: General;  Laterality: Left;  needle loc BCG 7:30  . BREAST BIOPSY Right 09/20/2013   Procedure: BREAST mass;  Surgeon: Stark Klein, MD;  Location: [place];  Service: General;  Laterality: Right;  . HERNIA REPAIR  1990  . MASS EXCISION Left 03/15/2013   Procedure: EXCISION OF TWO LEFT BREAST MASSES;  Surgeon: Stark Klein, MD;  Location: [place];  Service: General;  Laterality: Left;  . TYMPANOSTOMY TUBE PLACEMENT  1991  . WISDOM TOOTH EXTRACTION   2004    Family History  Problem Relation Age of Onset  . Hypertension Mother   . Depression Mother   . Anxiety disorder Sister   . Fibrocystic breast disease Maternal Grandmother   . Hypertension Paternal Grandmother   . Cancer Paternal Grandmother        stomach    Social History  Substance Use Topics  . Smoking status: Never Smoker  . Smokeless tobacco: Never Used  . Alcohol use No    Allergies: No Known Allergies  Prescriptions Prior to Admission  Medication Sig Dispense Refill Last Dose  . Ginger 500 MG CAPS Take 1 capsule by mouth as needed.   05/27/2017 at Unknown time  . levocetirizine (XYZAL) 5 MG tablet Take 5 mg by mouth every evening.   05/27/2017 at Unknown time  . levothyroxine (SYNTHROID, LEVOTHROID) 50 MCG tablet Take 1 tablet (50 mcg total) by mouth daily. (Patient taking differently: Take 50 mcg by mouth daily. Takes 2 tablets on Saturday) 90 tablet 1 05/28/2017 at Unknown time  . mometasone-formoterol (DULERA) 200-5 MCG/ACT AERO Inhale 2 puffs into the lungs 2 (two) times daily.   05/28/2017 at Unknown time  . montelukast (SINGULAIR) 10 MG tablet Take 10 mg by mouth at bedtime.   05/27/2017 at Unknown time  .  ondansetron (ZOFRAN) 4 MG tablet Take 4 mg by mouth every 8 (eight) hours as needed for nausea or vomiting.   05/28/2017 at Unknown time  . Prenatal Vit-Fe Fumarate-FA (PRENATAL MULTIVITAMIN) TABS tablet Take 1 tablet by mouth daily at 12 noon.   05/27/2017 at Unknown time  . albuterol (PROVENTIL HFA;VENTOLIN HFA) 108 (90 BASE) MCG/ACT inhaler Inhale 2 puffs into the lungs daily as needed for wheezing or shortness of breath.   Taking    Review of Systems  Constitutional: Negative.   Gastrointestinal: Positive for nausea and vomiting. Negative for abdominal pain, constipation and diarrhea.  Genitourinary: Positive for vaginal bleeding. Negative for dysuria and vaginal discharge.  Musculoskeletal: Positive for back pain.   Physical Exam   Blood pressure  133/70, pulse 74, temperature 98.5 F (36.9 C), temperature source Oral, resp. rate 18, weight 128 lb 4 oz (58.2 kg), last menstrual period 04/03/2017, SpO2 100 %, unknown if currently breastfeeding.  Physical Exam  Nursing note and vitals reviewed. Constitutional: She is oriented to person, place, and time. She appears well-developed and well-nourished. No distress.  HENT:  Head: Normocephalic and atraumatic.  Eyes: Conjunctivae are normal. Right eye exhibits no discharge. Left eye exhibits no discharge. No scleral icterus.  Neck: Normal range of motion.  Respiratory: Effort normal. No respiratory distress.  GI: Soft. She exhibits no distension. There is no tenderness.  Genitourinary: Cervix exhibits no motion tenderness and no friability. No bleeding in the vagina. Vaginal discharge (small amount of tan mucoid discharge) found.  Genitourinary Comments: Cervix closed  Neurological: She is alert and oriented to person, place, and time.  Skin: Skin is warm and dry. She is not diaphoretic.  Psychiatric: She has a normal mood and affect. Her behavior is normal. Judgment and thought content normal.    MAU Course  Procedures Results for orders placed or performed during the hospital encounter of 05/28/17 (from the past 24 hour(s))  Urinalysis, Routine w reflex microscopic     Status: Abnormal   Collection Time: 05/28/17  1:24 PM  Result Value Ref Range   Color, Urine STRAW (A) YELLOW   APPearance CLEAR CLEAR   Specific Gravity, Urine 1.003 (L) 1.005 - 1.030   pH 6.0 5.0 - 8.0   Glucose, UA NEGATIVE NEGATIVE mg/dL   Hgb urine dipstick MODERATE (A) NEGATIVE   Bilirubin Urine NEGATIVE NEGATIVE   Ketones, ur NEGATIVE NEGATIVE mg/dL   Protein, ur NEGATIVE NEGATIVE mg/dL   Nitrite NEGATIVE NEGATIVE   Leukocytes, UA MODERATE (A) NEGATIVE   RBC / HPF 0-5 0 - 5 RBC/hpf   WBC, UA 0-5 0 - 5 WBC/hpf   Bacteria, UA RARE (A) NONE SEEN   Squamous Epithelial / LPF 0-5 (A) NONE SEEN  Pregnancy,  urine POC     Status: Abnormal   Collection Time: 05/28/17  2:04 PM  Result Value Ref Range   Preg Test, Ur POSITIVE (A) NEGATIVE  Wet prep, genital     Status: Abnormal   Collection Time: 05/28/17  2:30 PM  Result Value Ref Range   Yeast Wet Prep HPF POC NONE SEEN NONE SEEN   Trich, Wet Prep NONE SEEN NONE SEEN   Clue Cells Wet Prep HPF POC NONE SEEN NONE SEEN   WBC, Wet Prep HPF POC MANY (A) NONE SEEN   Sperm NONE SEEN   CBC     Status: Abnormal   Collection Time: 05/28/17  2:48 PM  Result Value Ref Range   WBC 8.3 4.0 - 10.5  K/uL   RBC 3.83 (L) 3.87 - 5.11 MIL/uL   Hemoglobin 12.0 12.0 - 15.0 g/dL   HCT 34.3 (L) 36.0 - 46.0 %   MCV 89.6 78.0 - 100.0 fL   MCH 31.3 26.0 - 34.0 pg   MCHC 35.0 30.0 - 36.0 g/dL   RDW 13.0 11.5 - 15.5 %   Platelets 211 150 - 400 K/uL  hCG, quantitative, pregnancy     Status: Abnormal   Collection Time: 05/28/17  2:48 PM  Result Value Ref Range   hCG, Beta Chain, Quant, S 230,334 (H) <5 mIU/mL   US Ob Comp Less 14 Wks  Result Date: 05/28/2017 CLINICAL DATA:  Pregnant patient with vaginal bleeding. EXAM: OBSTETRIC <14 WK [place] AND TRANSVAGINAL OB US TECHNIQUE: Both transabdominal and transvaginal ultrasound examinations were performed for complete evaluation of the gestation as well as the maternal uterus, adnexal regions, and pelvic cul-de-sac. Transvaginal technique was performed to assess early pregnancy. COMPARISON:  Pelvic ultrasound 04/26/2016 FINDINGS: Intrauterine gestational sac: Single Yolk sac:  Visualized. Embryo:  Visualized. Cardiac Activity: Visualized. Heart Rate: 171  bpm CRL:  14  mm   7 w   4 d                  [place] EDC: 01/10/2018 Subchorionic hemorrhage:  None visualized. Maternal uterus/adnexae: There is a 3.9 x 2.8 x 2.6 cm cyst within the right ovary. Normal left ovary. No free fluid in the pelvis. IMPRESSION: Single live intrauterine gestation.  No acute process. Right ovarian cyst. Electronically Signed   By: Lovey Newcomer M.D.   On:  05/28/2017 15:44   US Ob Transvaginal  Result Date: 05/28/2017 CLINICAL DATA:  Pregnant patient with vaginal bleeding. EXAM: OBSTETRIC <14 WK [place] AND TRANSVAGINAL OB US TECHNIQUE: Both transabdominal and transvaginal ultrasound examinations were performed for complete evaluation of the gestation as well as the maternal uterus, adnexal regions, and pelvic cul-de-sac. Transvaginal technique was performed to assess early pregnancy. COMPARISON:  Pelvic ultrasound 04/26/2016 FINDINGS: Intrauterine gestational sac: Single Yolk sac:  Visualized. Embryo:  Visualized. Cardiac Activity: Visualized. Heart Rate: 171  bpm CRL:  14  mm   7 w   4 d                  [place] EDC: 01/10/2018 Subchorionic hemorrhage:  None visualized. Maternal uterus/adnexae: There is a 3.9 x 2.8 x 2.6 cm cyst within the right ovary. Normal left ovary. No free fluid in the pelvis. IMPRESSION: Single live intrauterine gestation.  No acute process. Right ovarian cyst. Electronically Signed   By: Lovey Newcomer M.D.   On: 05/28/2017 15:44     MDM B positive CBC, BHCG, ultrasound, GC/CT, & wet prep Minimal amount of blood noted on exam & cervix closed. Ultrasound confirms IUP with cardiac activity. Patient reassured by ultrasound. Spotting likely from TVUS performed yesterday.  Assessment and Plan  A; 1. Threatened miscarriage   2. Vaginal bleeding in pregnancy, first trimester   3. Normal IUP (intrauterine pregnancy) on prenatal ultrasound, first trimester    P: Discharge home Pelvic rest Start prenatal care Discussed reasons to return to MAU GC/CT pending  Jorje Guild 05/28/2017, 2:06 PM

## 2017-05-30 LAB — GC/CHLAMYDIA PROBE AMP (~~LOC~~) NOT AT ARMC
Chlamydia: NEGATIVE
Neisseria Gonorrhea: NEGATIVE

## 2017-06-08 ENCOUNTER — Encounter: Payer: Self-pay | Admitting: Family Medicine

## 2017-06-23 ENCOUNTER — Encounter: Payer: Self-pay | Admitting: Endocrinology

## 2017-06-27 ENCOUNTER — Other Ambulatory Visit: Payer: Managed Care, Other (non HMO)

## 2017-06-30 ENCOUNTER — Ambulatory Visit: Payer: Managed Care, Other (non HMO) | Admitting: Endocrinology

## 2017-07-13 ENCOUNTER — Other Ambulatory Visit: Payer: Managed Care, Other (non HMO)

## 2017-07-19 ENCOUNTER — Ambulatory Visit: Payer: Managed Care, Other (non HMO) | Admitting: Endocrinology

## 2017-07-20 ENCOUNTER — Other Ambulatory Visit: Payer: Self-pay | Admitting: Endocrinology

## 2018-02-09 ENCOUNTER — Other Ambulatory Visit: Payer: Self-pay | Admitting: Endocrinology

## 2018-03-06 ENCOUNTER — Other Ambulatory Visit (INDEPENDENT_AMBULATORY_CARE_PROVIDER_SITE_OTHER): Payer: Managed Care, Other (non HMO)

## 2018-03-06 DIAGNOSIS — E063 Autoimmune thyroiditis: Secondary | ICD-10-CM

## 2018-03-06 LAB — T4, FREE: FREE T4: 0.83 ng/dL (ref 0.60–1.60)

## 2018-03-06 LAB — TSH: TSH: 0.76 u[IU]/mL (ref 0.35–4.50)

## 2018-03-09 ENCOUNTER — Encounter: Payer: Self-pay | Admitting: Endocrinology

## 2018-03-09 ENCOUNTER — Ambulatory Visit (INDEPENDENT_AMBULATORY_CARE_PROVIDER_SITE_OTHER): Payer: Managed Care, Other (non HMO) | Admitting: Endocrinology

## 2018-03-09 VITALS — BP 138/82 | HR 84 | Ht 64.0 in | Wt 147.8 lb

## 2018-03-09 DIAGNOSIS — E063 Autoimmune thyroiditis: Secondary | ICD-10-CM | POA: Diagnosis not present

## 2018-03-09 NOTE — Progress Notes (Signed)
Patient ID: Janet Allen, female   DOB: 1991-01-25, 27 y.o.   MRN: [id number]           Referring Physician: Jonathon Jordan  Reason for Appointment:  Hypothyroidism, follow-up visit    History of Present Illness:   Hypothyroidism was first diagnosed in 11/2016  Patient had fatigue for a couple of years prior to her diagnosis and also some cold intolerance She said that she was having an  annual exam with her primary care physician in 1/18 and she was tested for multiple conditions because of her history of miscarriages.  Since the TSH was significantly high at 24 patient was started on levothyroxine 50 g daily  With this her energy level had improved  Last visit was in 7/18 when she was [redacted] weeks pregnant and her Synthroid dose of 50 mcg was increased by 2 tablets/week She was then followed by the gynecologist and no results are available Her Synthroid is now back to 50 mcg daily since her delivery  She is now about 2-1/2 months postpartum She does feel a little tired but nothing unexpected, no cold intolerance She still has not lost all the weight she had gained during pregnancy She is taking her levothyroxine consistently in the morning before breakfast        Patient's weight history is as follows:  Wt Readings from Last 3 Encounters:  03/09/18 147 lb 12.8 oz (67 kg)  05/28/17 128 lb 4 oz (58.2 kg)  05/20/17 126 lb 3.2 oz (57.2 kg)    Thyroid function results have been as follows:  TSH 23.7 done on 11/25/16  Lab Results  Component Value Date   FREET4 0.83 03/06/2018   FREET4 0.93 05/17/2017   FREET4 0.87 01/14/2017   TSH 0.76 03/06/2018   TSH 0.80 05/17/2017   TSH 2.07 01/14/2017    PROBLEM 2:   History of high prolactin level which was 51.1  in August 2017 This may have been checked because of her menstrual cycles being lighter.  She was told to stop her cabergoline on her initial consultation when her prolactin was low and she had no difficulty conceiving  last year She is now taking birth control pills   Past Medical History:  Diagnosis Date  . Allergy   . Asthma   . Headache   . Heart murmur   . History of recurrent UTIs   . Hyperthyroidism   . Pneumonia    hx  . PONV (postoperative nausea and vomiting)   . Tinnitus   . Vertigo   . Vitamin D deficiency     Past Surgical History:  Procedure Laterality Date  . BREAST BIOPSY Left 09/20/2013   Procedure: BREAST mass WITH NEEDLE LOCALIZATION;  Surgeon: Stark Klein, MD;  Location: [facility];  Service: General;  Laterality: Left;  needle loc BCG 7:30  . BREAST BIOPSY Right 09/20/2013   Procedure: BREAST mass;  Surgeon: Stark Klein, MD;  Location: [facility];  Service: General;  Laterality: Right;  . HERNIA REPAIR  1990  . MASS EXCISION Left 03/15/2013   Procedure: EXCISION OF TWO LEFT BREAST MASSES;  Surgeon: Stark Klein, MD;  Location: [facility];  Service: General;  Laterality: Left;  . TYMPANOSTOMY TUBE PLACEMENT  1991  . WISDOM TOOTH EXTRACTION  2004    Family History  Problem Relation Age of Onset  . Hypertension Mother   . Depression Mother   . Anxiety disorder Sister   . Fibrocystic breast disease Maternal Grandmother   .  Hypertension Paternal Grandmother   . Cancer Paternal Grandmother        stomach    Social History:  reports that she has never smoked. She has never used smokeless tobacco. She reports that she does not drink alcohol or use drugs.  Allergies: No Known Allergies  Allergies as of 03/09/2018   No Known Allergies     Medication List        Accurate as of 03/09/18  4:19 PM. Always use your most recent med list.          albuterol 108 (90 Base) MCG/ACT inhaler Commonly known as:  PROVENTIL HFA;VENTOLIN HFA Inhale 2 puffs into the lungs daily as needed for wheezing or shortness of breath.   DULERA 200-5 MCG/ACT Aero Generic drug:  mometasone-formoterol Inhale 2 puffs into the lungs 2 (two) times daily.   levocetirizine 5 MG tablet Commonly known as:   XYZAL Take 5 mg by mouth every evening.   levothyroxine 50 MCG tablet Commonly known as:  SYNTHROID, LEVOTHROID TAKE 1 TABLET BY MOUTH DAILY   montelukast 10 MG tablet Commonly known as:  SINGULAIR Take 10 mg by mouth at bedtime.          Review of Systems  She has history of asthma and allergic rhinitis        Examination:    BP 138/82 (BP Location: Right Arm, Patient Position: Sitting, Cuff Size: Normal)   Pulse 84   Ht 5\' 4"  (1.626 m)   Wt 147 lb 12.8 oz (67 kg)   LMP 04/03/2017   SpO2 98%   BMI 25.37 kg/m   Thyroid not palpable Biceps reflex on the right is normal No skin dryness No peripheral edema or facial puffiness   Assessment:  HYPOTHYROIDISM primary,  Baseline TSH 23.7  She has not had any significant progression of her hypothyroidism No results are available from treatment by the gynecologist during her pregnancy when she was given a slightly higher dose  Subjectively doing well with 50 mcg currently She looks euthyroid No goiter Although her TSH is below 1.0 at 0.76 now she is clinically doing well She still is about 20 pounds heavier than prepregnancy weight  Discussed possibility of postpartum thyroiditis but currently has not shown any signs of this  History of hyperprolactinemia: This has resolved and she is going to be on birth control pills from now on  PLAN:   No change in thyroid dosage but she will need to be seen back in 4 months for follow-up and if stable at that time she can come back annually  Total visit time = 15 minutes  [name] 03/09/2018, 4:19 PM    Note: This office note was prepared with Dragon voice recognition system technology. Any transcriptional errors that result from this process are unintentional.

## 2018-07-05 ENCOUNTER — Other Ambulatory Visit (INDEPENDENT_AMBULATORY_CARE_PROVIDER_SITE_OTHER): Payer: Managed Care, Other (non HMO)

## 2018-07-05 DIAGNOSIS — E063 Autoimmune thyroiditis: Secondary | ICD-10-CM | POA: Diagnosis not present

## 2018-07-05 LAB — T4, FREE: Free T4: 0.79 ng/dL (ref 0.60–1.60)

## 2018-07-05 LAB — TSH: TSH: 0.76 u[IU]/mL (ref 0.35–4.50)

## 2018-07-12 ENCOUNTER — Ambulatory Visit: Payer: Managed Care, Other (non HMO) | Admitting: Endocrinology

## 2018-07-12 ENCOUNTER — Encounter: Payer: Self-pay | Admitting: Endocrinology

## 2018-07-12 VITALS — BP 126/82 | HR 83 | Ht 64.0 in | Wt 149.0 lb

## 2018-07-12 DIAGNOSIS — E063 Autoimmune thyroiditis: Secondary | ICD-10-CM | POA: Diagnosis not present

## 2018-07-12 NOTE — Progress Notes (Signed)
Patient ID: Janet Allen, female   DOB: Sep 20, 1991, 27 y.o.   MRN: [id number]           Referring Physician: Jonathon Jordan  Reason for Appointment:  Hypothyroidism, follow-up visit    History of Present Illness:   Hypothyroidism was first diagnosed in 11/2016  Patient had fatigue for a couple of years prior to her diagnosis and also some cold intolerance She said that she was having an  annual exam with her primary care physician in 1/18 and she was tested for multiple conditions because of her history of miscarriages.  Since the TSH was significantly high at 24 patient was started on levothyroxine 50 g daily  With this her energy level had improved  During pregnancy her gynecologist was managing her levothyroxine dose and this was increased  Her Synthroid is  back to 50 mcg daily since her delivery in 2/19  On her last visit in 5/19 the dose was continued unchanged  She does feel tired but is back to full-time work along with taking care of her baby Weight is still about the same  She is taking her levothyroxine consistently in the morning before breakfast without any multivitamins at the same time        Patient's weight history is as follows:  Wt Readings from Last 3 Encounters:  07/12/18 149 lb (67.6 kg)  03/09/18 147 lb 12.8 oz (67 kg)  05/28/17 128 lb 4 oz (58.2 kg)    Thyroid function results have been as follows:  TSH 23.7 done on 11/25/16  Lab Results  Component Value Date   FREET4 0.79 07/05/2018   FREET4 0.83 03/06/2018   FREET4 0.93 05/17/2017   TSH 0.76 07/05/2018   TSH 0.76 03/06/2018   TSH 0.80 05/17/2017    PROBLEM 2:   History of high prolactin level which was 51.1  in August 2017 This may have been checked because of her menstrual cycles being lighter.  She was told to stop her cabergoline on her initial consultation when her prolactin was low She is now taking birth control pills   Past Medical History:  Diagnosis Date  . Allergy    . Asthma   . Headache   . Heart murmur   . History of recurrent UTIs   . Hyperthyroidism   . Pneumonia    hx  . PONV (postoperative nausea and vomiting)   . Tinnitus   . Vertigo   . Vitamin D deficiency     Past Surgical History:  Procedure Laterality Date  . BREAST BIOPSY Left 09/20/2013   Procedure: BREAST mass WITH NEEDLE LOCALIZATION;  Surgeon: Stark Klein, MD;  Location: [facility];  Service: General;  Laterality: Left;  needle loc BCG 7:30  . BREAST BIOPSY Right 09/20/2013   Procedure: BREAST mass;  Surgeon: Stark Klein, MD;  Location: [facility];  Service: General;  Laterality: Right;  . HERNIA REPAIR  1990  . MASS EXCISION Left 03/15/2013   Procedure: EXCISION OF TWO LEFT BREAST MASSES;  Surgeon: Stark Klein, MD;  Location: [facility];  Service: General;  Laterality: Left;  . TYMPANOSTOMY TUBE PLACEMENT  1991  . WISDOM TOOTH EXTRACTION  2004    Family History  Problem Relation Age of Onset  . Hypertension Mother   . Depression Mother   . Anxiety disorder Sister   . Fibrocystic breast disease Maternal Grandmother   . Hypertension Paternal Grandmother   . Cancer Paternal Grandmother        stomach  Social History:  reports that she has never smoked. She has never used smokeless tobacco. She reports that she does not drink alcohol or use drugs.  Allergies: No Known Allergies  Allergies as of 07/12/2018   No Known Allergies     Medication List        Accurate as of 07/12/18  3:56 PM. Always use your most recent med list.          albuterol 108 (90 Base) MCG/ACT inhaler Commonly known as:  PROVENTIL HFA;VENTOLIN HFA Inhale 2 puffs into the lungs daily as needed for wheezing or shortness of breath.   DULERA 200-5 MCG/ACT Aero Generic drug:  mometasone-formoterol Inhale 2 puffs into the lungs 2 (two) times daily.   levocetirizine 5 MG tablet Commonly known as:  XYZAL Take 5 mg by mouth every evening.   levothyroxine 50 MCG tablet Commonly known as:  SYNTHROID,  LEVOTHROID TAKE 1 TABLET BY MOUTH DAILY   montelukast 10 MG tablet Commonly known as:  SINGULAIR Take 10 mg by mouth at bedtime.          Review of Systems  She has history of asthma and allergic rhinitis        Examination:    BP 126/82   Pulse 83   Ht 5\' 4"  (1.626 m)   Wt 149 lb (67.6 kg)   SpO2 (!) 83%   BMI 25.58 kg/m   Thyroid not palpable Biceps reflex on the right is normal No skin dryness No peripheral edema or facial puffiness   Assessment:  HYPOTHYROIDISM primary,  Baseline TSH 23.7  She has been on fairly stable dose of 50 mcg of levothyroxine except when she was pregnant Difficult to judge her symptoms since she has fatigue from taking care of a small child She is compliant with her regimen TSH is still below 1 and stable although free T4 slightly lower than before  PLAN:   No change in levothyroxine  Follow-up in 6 months  Janet Allen 07/12/2018, 3:56 PM    Note: This office note was prepared with Dragon voice recognition system technology. Any transcriptional errors that result from this process are unintentional.

## 2019-01-01 ENCOUNTER — Other Ambulatory Visit: Payer: Self-pay

## 2019-01-01 ENCOUNTER — Other Ambulatory Visit (INDEPENDENT_AMBULATORY_CARE_PROVIDER_SITE_OTHER): Payer: Managed Care, Other (non HMO)

## 2019-01-01 DIAGNOSIS — E063 Autoimmune thyroiditis: Secondary | ICD-10-CM

## 2019-01-01 LAB — TSH: TSH: 1.32 u[IU]/mL (ref 0.35–4.50)

## 2019-01-01 LAB — T4, FREE: Free T4: 0.74 ng/dL (ref 0.60–1.60)

## 2019-01-02 ENCOUNTER — Other Ambulatory Visit: Payer: Managed Care, Other (non HMO)

## 2019-01-09 ENCOUNTER — Ambulatory Visit: Payer: Managed Care, Other (non HMO) | Admitting: Endocrinology

## 2019-01-22 ENCOUNTER — Ambulatory Visit: Payer: Managed Care, Other (non HMO) | Admitting: Endocrinology

## 2019-03-03 ENCOUNTER — Other Ambulatory Visit: Payer: Self-pay | Admitting: Endocrinology

## 2019-06-01 ENCOUNTER — Other Ambulatory Visit: Payer: Self-pay | Admitting: Endocrinology

## 2019-06-25 ENCOUNTER — Other Ambulatory Visit: Payer: Self-pay | Admitting: Endocrinology

## 2019-10-09 LAB — TSH: TSH: 0.61 (ref <=5.90)

## 2019-10-25 ENCOUNTER — Ambulatory Visit: Payer: Managed Care, Other (non HMO) | Admitting: Endocrinology

## 2019-11-06 ENCOUNTER — Telehealth: Payer: Self-pay

## 2019-11-06 ENCOUNTER — Other Ambulatory Visit: Payer: Self-pay

## 2019-11-06 ENCOUNTER — Ambulatory Visit: Payer: Managed Care, Other (non HMO) | Admitting: Endocrinology

## 2019-11-06 VITALS — BP 124/68 | HR 81 | Temp 97.9°F | Ht 65.0 in | Wt 159.6 lb

## 2019-11-06 DIAGNOSIS — E063 Autoimmune thyroiditis: Secondary | ICD-10-CM | POA: Diagnosis not present

## 2019-11-06 NOTE — Progress Notes (Signed)
Patient ID: Janet Allen, female   DOB: 1990-11-30, 29 y.o.   MRN: [id number]           Referring Physician: Jonathon Jordan  Reason for Appointment:  Hypothyroidism, follow-up visit    History of Present Illness:   Hypothyroidism was first diagnosed in 11/2016  Patient had fatigue for a couple of years prior to her diagnosis and also some cold intolerance She said that she was having an  annual exam with her primary care physician in 1/18 and she was tested for multiple conditions because of her history of miscarriages.  Since the TSH was significantly high at 24 patient was started on levothyroxine 50 g daily  With this her energy level had improved  During her last pregnancy in 2018/19 her gynecologist was managing her levothyroxine dose and the dose was increased  Her Synthroid has been again 50 mcg daily since her delivery in 2/19 However she has not been following up as directed On her last visit in 07/2018 the dose was continued unchanged  She does feel tired but is now [redacted] weeks pregnant and taking care of her 48-year-old child, also is working Tends to feel cold  She has gained some weight during this pregnancy and before  She is taking her levothyroxine consistently in the morning before breakfast She takes her vitamins later        Her thyroid levels are again normal  Patient's weight history is as follows:  Wt Readings from Last 3 Encounters:  11/06/19 159 lb 9.6 oz (72.4 kg)  07/12/18 149 lb (67.6 kg)  03/09/18 147 lb 12.8 oz (67 kg)    Thyroid function results have been as follows:  Baseline TSH 23.7 done on 11/25/16  Lab Results  Component Value Date   TSH 0.61 10/09/2019   TSH 1.32 01/01/2019   TSH 0.76 07/05/2018   FREET4 0.74 01/01/2019   FREET4 0.79 07/05/2018   FREET4 0.83 03/06/2018     Past Medical History:  Diagnosis Date  . Allergy   . Asthma   . Headache   . Heart murmur   . History of recurrent UTIs   . Hyperthyroidism   .  Pneumonia    hx  . PONV (postoperative nausea and vomiting)   . Tinnitus   . Vertigo   . Vitamin D deficiency     Past Surgical History:  Procedure Laterality Date  . BREAST BIOPSY Left 09/20/2013   Procedure: BREAST mass WITH NEEDLE LOCALIZATION;  Surgeon: Stark Klein, MD;  Location: [facility];  Service: General;  Laterality: Left;  needle loc BCG 7:30  . BREAST BIOPSY Right 09/20/2013   Procedure: BREAST mass;  Surgeon: Stark Klein, MD;  Location: [facility];  Service: General;  Laterality: Right;  . HERNIA REPAIR  1990  . MASS EXCISION Left 03/15/2013   Procedure: EXCISION OF TWO LEFT BREAST MASSES;  Surgeon: Stark Klein, MD;  Location: [facility];  Service: General;  Laterality: Left;  . TYMPANOSTOMY TUBE PLACEMENT  1991  . WISDOM TOOTH EXTRACTION  2004    Family History  Problem Relation Age of Onset  . Hypertension Mother   . Depression Mother   . Anxiety disorder Sister   . Fibrocystic breast disease Maternal Grandmother   . Hypertension Paternal Grandmother   . Cancer Paternal Grandmother        stomach    Social History:  reports that she has never smoked. She has never used smokeless tobacco. She reports that she does  not drink alcohol or use drugs.  Allergies: No Known Allergies  Allergies as of 11/06/2019   No Known Allergies     Medication List       Accurate as of November 06, 2019  4:21 PM. If you have any questions, ask your nurse or doctor.        albuterol 108 (90 Base) MCG/ACT inhaler Commonly known as: VENTOLIN HFA Inhale 2 puffs into the lungs daily as needed for wheezing or shortness of breath.   Dulera 200-5 MCG/ACT Aero Generic drug: mometasone-formoterol Inhale 2 puffs into the lungs 2 (two) times daily.   Junel FE 1/20 1-20 MG-MCG tablet Generic drug: norethindrone-ethinyl estradiol   levocetirizine 5 MG tablet Commonly known as: XYZAL Take 5 mg by mouth every evening.   levothyroxine 50 MCG tablet Commonly known as: SYNTHROID TAKE 1 TABLET BY  MOUTH EVERY DAY   montelukast 10 MG tablet Commonly known as: SINGULAIR Take 10 mg by mouth at bedtime.   PRENATAL VIT-FE FUMARATE-FA PO Take by mouth.          Review of Systems  She has history of asthma and allergic rhinitis        History of high prolactin level which was 51.1  in August 2017 This may have been checked because of her menstrual cycles being lighter.  She was told to stop her cabergoline on her initial consultation when her prolactin was low   Examination:    BP 124/68 (BP Location: Left Arm, Patient Position: Sitting, Cuff Size: Normal)   Pulse 81   Temp 97.9 F (36.6 C)   Ht 5\' 5"  (1.651 m)   Wt 159 lb 9.6 oz (72.4 kg)   SpO2 99%   BMI 26.56 kg/m   Thyroid just palpable, smooth, soft and diffuse  Biceps reflex on the right is normal Skin appears normal No tremor   Assessment:  HYPOTHYROIDISM primary, baseline TSH 23.7  She has been on a stable dose of 50 mcg of levothyroxine except when she was pregnant previously  She is now about [redacted] weeks pregnant but her TSH about 3 weeks ago was still quite normal at 0.6 As before she has fatigue from multiple other factors and no symptoms of hypo or hyperthyroidism Difficult to judge her symptoms since she has fatigue from taking care of a small child She is compliant with her regimen TSH is still below 1 and stable although free T4 slightly lower than before  PLAN:   No change in levothyroxine  Follow-up in about 8 weeks, she can have labs done through her PCP  [name] 11/06/2019, 4:21 PM    Note: This office note was prepared with Dragon voice recognition system technology. Any transcriptional errors that result from this process are unintentional.  This visit occurred during the SARS-CoV-2 public health emergency.  Safety protocols were in place, including screening questions prior to the visit, additional usage of staff PPE, and extensive cleaning of exam room while observing  appropriate contact time as indicated for disinfecting solutions.

## 2019-11-06 NOTE — Telephone Encounter (Signed)
MEDICATION: levothyroxine (SYNTHROID) 50 MCG tablet  PHARMACY:  [id number] - [place], [place] - [address]  IS THIS A 90 DAY SUPPLY : no  IS PATIENT OUT OF MEDICATION:   IF NOT; HOW MUCH IS LEFT:   LAST APPOINTMENT DATE: @1 /03/2020  NEXT APPOINTMENT DATE:@3 /12/2019  DO WE HAVE YOUR PERMISSION TO LEAVE A DETAILED MESSAGE:  OTHER COMMENTS:    **Let patient know to contact pharmacy at the end of the day to make sure medication is ready. **  ** Please notify patient to allow 48-72 hours to process**  **Encourage patient to contact the pharmacy for refills or they can request refills through [facility]**

## 2019-11-07 ENCOUNTER — Other Ambulatory Visit: Payer: Self-pay

## 2019-11-07 MED ORDER — LEVOTHYROXINE SODIUM 50 MCG PO TABS: 50.0000 ug | ORAL_TABLET | Freq: Every day | ORAL | 1 refills | Status: DC

## 2019-11-07 NOTE — Telephone Encounter (Signed)
Rx sent 

## 2019-12-26 LAB — T4, FREE: Free T4: 0.82 ng/dL (ref 0.82–1.77)

## 2019-12-26 LAB — TSH: TSH: 1 u[IU]/mL (ref 0.450–4.500)

## 2020-01-01 ENCOUNTER — Ambulatory Visit (INDEPENDENT_AMBULATORY_CARE_PROVIDER_SITE_OTHER): Payer: Managed Care, Other (non HMO) | Admitting: Endocrinology

## 2020-01-01 ENCOUNTER — Other Ambulatory Visit: Payer: Self-pay

## 2020-01-01 ENCOUNTER — Encounter: Payer: Self-pay | Admitting: Endocrinology

## 2020-01-01 DIAGNOSIS — E063 Autoimmune thyroiditis: Secondary | ICD-10-CM | POA: Diagnosis not present

## 2020-01-01 NOTE — Progress Notes (Signed)
Patient ID: Janet Allen, female   DOB: 02/02/1991, 29 y.o.   MRN: [id number]           Referring Physician: Jonathon Jordan  I connected with the above-named patient by video enabled telemedicine application and verified that I am speaking with the correct person. The patient was explained the limitations of evaluation and management by telemedicine and the availability of in person appointments.  Patient also understood that there may be a patient responsible charge related to this service . Location of the patient: Patient's home . Location of the provider: Physician office Only the patient and myself were participating in the encounter The patient understood the above statements and agreed to proceed.   Reason for Appointment:  Hypothyroidism, follow-up visit    History of Present Illness:   Hypothyroidism was first diagnosed in 11/2016  Patient had fatigue for a couple of years prior to her diagnosis and also some cold intolerance She said that she was having an  annual exam with her primary care physician in 1/18 and she was tested for multiple conditions because of her history of miscarriages.  Since the TSH was significantly high at 24 patient was started on levothyroxine 50 g daily  With this her energy level had improved  During her last pregnancy in 2018/19 her gynecologist was managing her levothyroxine dose and the dose was increased  Her Synthroid has been again 50 mcg daily since her delivery for her first pregnancy in 2/19 Follow-up has been irregular previously On her last visit in 11/2019 the dose was continued unchanged  She does feel tired but this is less prominent than on her last visit when she was [redacted] weeks pregnant  Tends to feel cold as before which is not significant  She has gained usual amount of weight during this pregnancy but also is having some leg swelling Her due date is June 30  She is taking her levothyroxine consistently in the morning  before breakfast She takes her prenatal vitamin at night        Her thyroid levels are again normal with normal TSH  Patient's weight history is as follows:  Wt Readings from Last 3 Encounters:  11/06/19 159 lb 9.6 oz (72.4 kg)  07/12/18 149 lb (67.6 kg)  03/09/18 147 lb 12.8 oz (67 kg)    Thyroid function results have been as follows:  Baseline TSH 23.7 done on 11/25/16  Lab Results  Component Value Date   TSH 1.000 11/06/2019   TSH 0.61 10/09/2019   TSH 1.32 01/01/2019   FREET4 0.82 11/06/2019   FREET4 0.74 01/01/2019   FREET4 0.79 07/05/2018     Past Medical History:  Diagnosis Date  . Allergy   . Asthma   . Headache   . Heart murmur   . History of recurrent UTIs   . Hyperthyroidism   . Pneumonia    hx  . PONV (postoperative nausea and vomiting)   . Tinnitus   . Vertigo   . Vitamin D deficiency     Past Surgical History:  Procedure Laterality Date  . BREAST BIOPSY Left 09/20/2013   Procedure: BREAST mass WITH NEEDLE LOCALIZATION;  Surgeon: Stark Klein, MD;  Location: [facility];  Service: General;  Laterality: Left;  needle loc BCG 7:30  . BREAST BIOPSY Right 09/20/2013   Procedure: BREAST mass;  Surgeon: Stark Klein, MD;  Location: [facility];  Service: General;  Laterality: Right;  . HERNIA REPAIR  1990  . MASS EXCISION  Left 03/15/2013   Procedure: EXCISION OF TWO LEFT BREAST MASSES;  Surgeon: Stark Klein, MD;  Location: [facility];  Service: General;  Laterality: Left;  . TYMPANOSTOMY TUBE PLACEMENT  1991  . WISDOM TOOTH EXTRACTION  2004    Family History  Problem Relation Age of Onset  . Hypertension Mother   . Depression Mother   . Anxiety disorder Sister   . Fibrocystic breast disease Maternal Grandmother   . Hypertension Paternal Grandmother   . Cancer Paternal Grandmother        stomach    Social History:  reports that she has never smoked. She has never used smokeless tobacco. She reports that she does not drink alcohol or use drugs.  Allergies: No  Known Allergies  Allergies as of 01/01/2020   No Known Allergies     Medication List       Accurate as of January 01, 2020  8:22 AM. If you have any questions, ask your nurse or doctor.        albuterol 108 (90 Base) MCG/ACT inhaler Commonly known as: VENTOLIN HFA Inhale 2 puffs into the lungs daily as needed for wheezing or shortness of breath.   levocetirizine 5 MG tablet Commonly known as: XYZAL Take 5 mg by mouth every evening.   levothyroxine 50 MCG tablet Commonly known as: SYNTHROID Take 1 tablet (50 mcg total) by mouth daily.   montelukast 10 MG tablet Commonly known as: SINGULAIR Take 10 mg by mouth at bedtime.   PRENATAL VIT-FE FUMARATE-FA PO Take by mouth.          Review of Systems  She has history of asthma and allergic rhinitis       She has had her Covid vaccine  History of high prolactin level which was 51.1  in August 2017 Not a problem subsequently    Examination:    There were no vitals taken for this visit.    Assessment:  HYPOTHYROIDISM primary, baseline TSH 23.7  She has been on a stable dose of 50 mcg of levothyroxine except when she she had her first pregnancy  She is about [redacted] weeks pregnant and her dosage so far has not been changed Recently her TSH is still quite normal at 1.0 Although she does have some fatigue this is likely nonspecific related to her pregnancy and other factors She is not reporting any unusual cold intolerance or weight change On her regimen of taking her levothyroxine and separating it from her prenatal vitamins is still quite good  PLAN:   She will continue 50 mcg of levothyroxine  Follow-up in 2 months again, she can have labs done through her work as before  [name] 01/01/2020, 8:22 AM    Note: This office note was prepared with Dragon voice recognition system technology. Any transcriptional errors that result from this process are unintentional.  This visit occurred during the SARS-CoV-2 public  health emergency.  Safety protocols were in place, including screening questions prior to the visit, additional usage of staff PPE, and extensive cleaning of exam room while observing appropriate contact time as indicated for disinfecting solutions.

## 2020-02-18 ENCOUNTER — Telehealth: Payer: Self-pay

## 2020-02-18 NOTE — Telephone Encounter (Signed)
Labs have already been ordered previously.  Since she needs to have her thyroid gland examined she will need to see me in person

## 2020-02-18 NOTE — Telephone Encounter (Signed)
Patient has scheduled her follow up (virtual) patient would like orders placed for her labs and mark pending so she can pull them out of Epic and have them done at her work.

## 2020-02-19 NOTE — Telephone Encounter (Signed)
Called pt and left voicemail requesting a call back in order to give her this information.

## 2020-02-19 NOTE — Telephone Encounter (Signed)
Attempted to call pt again, and she did not answer. Will make one last attempt.

## 2020-02-20 NOTE — Telephone Encounter (Signed)
Attempted to call pt again. She did not answer.

## 2020-03-06 ENCOUNTER — Ambulatory Visit: Payer: Managed Care, Other (non HMO) | Admitting: Endocrinology

## 2020-03-10 ENCOUNTER — Other Ambulatory Visit: Payer: Self-pay | Admitting: Endocrinology

## 2020-03-10 ENCOUNTER — Telehealth: Payer: Self-pay

## 2020-03-10 LAB — TSH: TSH: 0.66 (ref <=5.90)

## 2020-03-10 NOTE — Telephone Encounter (Signed)
Patient called today requesting her lab requisitions be faxed to her office-I faxed them to [phone]

## 2020-03-13 LAB — T4, FREE: Free T4: 0.92 ng/dL (ref 0.82–1.77)

## 2020-03-13 LAB — TSH: TSH: 0.659 u[IU]/mL (ref 0.450–4.500)

## 2020-03-17 ENCOUNTER — Other Ambulatory Visit: Payer: Self-pay

## 2020-03-17 ENCOUNTER — Encounter: Payer: Self-pay | Admitting: Endocrinology

## 2020-03-17 ENCOUNTER — Ambulatory Visit: Payer: Managed Care, Other (non HMO) | Admitting: Endocrinology

## 2020-03-17 VITALS — BP 122/70 | HR 76 | Ht 65.0 in | Wt 187.0 lb

## 2020-03-17 DIAGNOSIS — E063 Autoimmune thyroiditis: Secondary | ICD-10-CM | POA: Diagnosis not present

## 2020-03-17 NOTE — Addendum Note (Signed)
Addended by: [name] on: 03/17/2020 01:08 PM   Modules accepted: Orders

## 2020-03-17 NOTE — Progress Notes (Signed)
Patient ID: Janet Allen, female   DOB: Oct 26, 1991, 29 y.o.   MRN: [id number]           Referring Physician: Jonathon Jordan    Reason for Appointment:  Hypothyroidism, follow-up visit    History of Present Illness:   Hypothyroidism was first diagnosed in 11/2016  Patient had fatigue for a couple of years prior to her diagnosis and also some cold intolerance She said that she was having an  annual exam with her primary care physician in 1/18 and she was tested for multiple conditions because of her history of miscarriages.  Since the TSH was significantly high at 24 patient was started on levothyroxine 50 g daily  With this her energy level had improved  During her last pregnancy in 2018/19 her gynecologist was managing her levothyroxine dose and the dose was increased  Recent history: Her Synthroid dosage has been again 50 mcg daily since her delivery for her first pregnancy in 2/19  On her last visit in 12/2019 the dose was continued unchanged  She is here for short-term follow-up since she is now in the third trimester of pregnancy Do not feel that she is unusually tired Recently taking iron supplements also and she takes these at night  Her due date is June 30  She is taking her levothyroxine consistently in the morning before breakfast She takes a prenatal vitamin at night        Her thyroid levels are again normal with normal TSH which was done at [facility]  Patient's weight history is as follows:  Wt Readings from Last 3 Encounters:  03/17/20 187 lb (84.8 kg)  11/06/19 159 lb 9.6 oz (72.4 kg)  07/12/18 149 lb (67.6 kg)    Thyroid function results have been as follows:  Baseline TSH 23.7 done on 11/25/16  Lab Results  Component Value Date   TSH 1.000 11/06/2019   TSH 0.61 10/09/2019   TSH 1.32 01/01/2019   FREET4 0.82 11/06/2019   FREET4 0.74 01/01/2019   FREET4 0.79 07/05/2018     Past Medical History:  Diagnosis Date  . Allergy   . Asthma   .  Headache   . Heart murmur   . History of recurrent UTIs   . Hyperthyroidism   . Pneumonia    hx  . PONV (postoperative nausea and vomiting)   . Tinnitus   . Vertigo   . Vitamin D deficiency     Past Surgical History:  Procedure Laterality Date  . BREAST BIOPSY Left 09/20/2013   Procedure: BREAST mass WITH NEEDLE LOCALIZATION;  Surgeon: Stark Klein, MD;  Location: [place];  Service: General;  Laterality: Left;  needle loc BCG 7:30  . BREAST BIOPSY Right 09/20/2013   Procedure: BREAST mass;  Surgeon: Stark Klein, MD;  Location: [place];  Service: General;  Laterality: Right;  . HERNIA REPAIR  1990  . MASS EXCISION Left 03/15/2013   Procedure: EXCISION OF TWO LEFT BREAST MASSES;  Surgeon: Stark Klein, MD;  Location: [place];  Service: General;  Laterality: Left;  . TYMPANOSTOMY TUBE PLACEMENT  1991  . WISDOM TOOTH EXTRACTION  2004    Family History  Problem Relation Age of Onset  . Hypertension Mother   . Depression Mother   . Anxiety disorder Sister   . Fibrocystic breast disease Maternal Grandmother   . Hypertension Paternal Grandmother   . Cancer Paternal Grandmother        stomach    Social History:  reports  that she has never smoked. She has never used smokeless tobacco. She reports that she does not drink alcohol or use drugs.  Allergies: No Known Allergies  Allergies as of 03/17/2020   No Known Allergies     Medication List       Accurate as of Mar 17, 2020 11:04 AM. If you have any questions, ask your nurse or doctor.        albuterol 108 (90 Base) MCG/ACT inhaler Commonly known as: VENTOLIN HFA Inhale 2 puffs into the lungs daily as needed for wheezing or shortness of breath.   levocetirizine 5 MG tablet Commonly known as: XYZAL Take 5 mg by mouth every evening.   levothyroxine 50 MCG tablet Commonly known as: SYNTHROID Take 1 tablet (50 mcg total) by mouth daily.   montelukast 10 MG tablet Commonly known as: SINGULAIR Take 10 mg by mouth at  bedtime.   PRENATAL VIT-FE FUMARATE-FA PO Take by mouth.          Review of Systems  She has history of asthma and allergic rhinitis       She has had her Covid vaccine  History of high prolactin level which was 51.1  in August 2017 Not a problem subsequently    Examination:    BP 122/70 (BP Location: Left Arm, Patient Position: Sitting, Cuff Size: Normal)   Pulse 76   Ht 5\' 5"  (1.651 m)   Wt 187 lb (84.8 kg)   SpO2 99%   BMI 31.12 kg/m   Fullness of the right lobe, soft, no significant enlargement.  Left lobe not palpable   Assessment:  HYPOTHYROIDISM primary, baseline TSH 23.7  She has been on a stable dose of 50 mcg of levothyroxine except when she she had her first pregnancy  She is in her last trimester of pregnancy expecting delivery in about 6 weeks  Although in her previous pregnancy she had reportedly taken a higher dose of levothyroxine she is still taking the same dose of 50 mcg  Subjectively doing well without any unusual fatigue TSH is 0.66 She takes her levothyroxine daily, taking iron and vitamins at night  PLAN:   She will continue 50 mcg of levothyroxine  Follow-up 6 weeks after delivery  [name] 03/17/2020, 11:04 AM    Note: This office note was prepared with Dragon voice recognition system technology. Any transcriptional errors that result from this process are unintentional.  This visit occurred during the SARS-CoV-2 public health emergency.  Safety protocols were in place, including screening questions prior to the visit, additional usage of staff PPE, and extensive cleaning of exam room while observing appropriate contact time as indicated for disinfecting solutions.

## 2020-04-26 ENCOUNTER — Other Ambulatory Visit: Payer: Self-pay | Admitting: Endocrinology

## 2020-06-16 ENCOUNTER — Other Ambulatory Visit (INDEPENDENT_AMBULATORY_CARE_PROVIDER_SITE_OTHER): Payer: Managed Care, Other (non HMO)

## 2020-06-16 ENCOUNTER — Other Ambulatory Visit: Payer: Self-pay

## 2020-06-16 DIAGNOSIS — E063 Autoimmune thyroiditis: Secondary | ICD-10-CM | POA: Diagnosis not present

## 2020-06-16 LAB — T4, FREE: Free T4: 0.8 ng/dL (ref 0.60–1.60)

## 2020-06-16 LAB — TSH: TSH: 0.5 u[IU]/mL (ref 0.35–4.50)

## 2020-06-19 ENCOUNTER — Other Ambulatory Visit: Payer: Self-pay

## 2020-06-19 ENCOUNTER — Telehealth (INDEPENDENT_AMBULATORY_CARE_PROVIDER_SITE_OTHER): Payer: Managed Care, Other (non HMO) | Admitting: Endocrinology

## 2020-06-19 ENCOUNTER — Encounter: Payer: Self-pay | Admitting: Endocrinology

## 2020-06-19 VITALS — Wt 175.0 lb

## 2020-06-19 DIAGNOSIS — E063 Autoimmune thyroiditis: Secondary | ICD-10-CM | POA: Diagnosis not present

## 2020-06-19 NOTE — Progress Notes (Signed)
Patient ID: Janet Allen, female   DOB: 1991-08-07, 29 y.o.   MRN: [id number]           Referring Physician: Jonathon Jordan  I connected with the above-named patient by video enabled telemedicine application and verified that I am speaking with the correct person. The patient was explained the limitations of evaluation and management by telemedicine and the availability of in person appointments.  Patient also understood that there may be a patient responsible charge related to this service . Location of the patient: Patient's home . Location of the provider: Physician office Only the patient and myself were participating in the encounter The patient understood the above statements and agreed to proceed.   Reason for Appointment:  Hypothyroidism, follow-up visit    History of Present Illness:   Hypothyroidism was first diagnosed in 11/2016  Patient had fatigue for a couple of years prior to her diagnosis and also some cold intolerance She said that she was having an  annual exam with her primary care physician in 1/18 and she was tested for multiple conditions because of her history of miscarriages.  Since the TSH was significantly high at 24 patient was started on levothyroxine 50 g daily  With this her energy level had improved  During her last pregnancy in 2018/19 her gynecologist was managing her levothyroxine dose and the dose was increased  Recent history: Her Synthroid dosage has been 50 mcg daily since her delivery for her first pregnancy in 2/19  On her last visit in 03/2020 the dose was continued unchanged  She is here for short-term follow-up postpartum, her delivery was in late June 2021 She feels fairly good overall with no unusual fatigue Prepregnancy weight was 149  She is taking her levothyroxine consistently in the morning before breakfast She takes a prenatal vitamin at night as before        Her thyroid levels show normal TSH, about the same as before  and usually low normal with no change in free T4  Patient's weight history is as follows:  Wt Readings from Last 3 Encounters:  06/19/20 175 lb (79.4 kg)  03/17/20 187 lb (84.8 kg)  11/06/19 159 lb 9.6 oz (72.4 kg)    Thyroid function results have been as follows:  Baseline TSH 23.7 done on 11/25/16  Lab Results  Component Value Date   TSH 0.50 06/16/2020   TSH 0.66 03/10/2020   TSH 0.659 03/10/2020   FREET4 0.80 06/16/2020   FREET4 0.92 03/10/2020   FREET4 0.82 11/06/2019     Past Medical History:  Diagnosis Date  . Allergy   . Asthma   . Headache   . Heart murmur   . History of recurrent UTIs   . Hyperthyroidism   . Pneumonia    hx  . PONV (postoperative nausea and vomiting)   . Tinnitus   . Vertigo   . Vitamin D deficiency     Past Surgical History:  Procedure Laterality Date  . BREAST BIOPSY Left 09/20/2013   Procedure: BREAST mass WITH NEEDLE LOCALIZATION;  Surgeon: Stark Klein, MD;  Location: [place];  Service: General;  Laterality: Left;  needle loc BCG 7:30  . BREAST BIOPSY Right 09/20/2013   Procedure: BREAST mass;  Surgeon: Stark Klein, MD;  Location: [place];  Service: General;  Laterality: Right;  . HERNIA REPAIR  1990  . MASS EXCISION Left 03/15/2013   Procedure: EXCISION OF TWO LEFT BREAST MASSES;  Surgeon: Stark Klein, MD;  Location:  [facility];  Service: General;  Laterality: Left;  . TYMPANOSTOMY TUBE PLACEMENT  1991  . WISDOM TOOTH EXTRACTION  2004    Family History  Problem Relation Age of Onset  . Hypertension Mother   . Depression Mother   . Anxiety disorder Sister   . Fibrocystic breast disease Maternal Grandmother   . Hypertension Paternal Grandmother   . Cancer Paternal Grandmother        stomach    Social History:  reports that she has never smoked. She has never used smokeless tobacco. She reports that she does not drink alcohol and does not use drugs.  Allergies: No Known Allergies  Allergies as of 06/19/2020   No Known  Allergies     Medication List       Accurate as of June 19, 2020  3:19 PM. If you have any questions, ask your nurse or doctor.        albuterol 108 (90 Base) MCG/ACT inhaler Commonly known as: VENTOLIN HFA Inhale 2 puffs into the lungs daily as needed for wheezing or shortness of breath.   levocetirizine 5 MG tablet Commonly known as: XYZAL Take 5 mg by mouth every evening.   levothyroxine 50 MCG tablet Commonly known as: SYNTHROID TAKE 1 TABLET BY MOUTH EVERY DAY   montelukast 10 MG tablet Commonly known as: SINGULAIR Take 10 mg by mouth at bedtime.   PRENATAL VIT-FE FUMARATE-FA PO Take by mouth.          Review of Systems  She has history of asthma and allergic rhinitis       She has had her Covid vaccine  History of high prolactin level which was 51.1  in August 2017 Subsequently has been normal    Examination:    Wt 175 lb (79.4 kg)   BMI 29.12 kg/m   Video visit, no exam done   Assessment:  HYPOTHYROIDISM primary, baseline TSH 23.7  She has been on a stable dose of 50 mcg of levothyroxine except when she she had her first pregnancy  She is now 6 weeks postpartum approximately and her thyroid levels have not changed Previously had a small right-sided goiter  She is doing fairly well with no symptoms suggestive of hypothyroidism or hyperthyroidism  TSH is 0.55, previously 0.66 She takes her levothyroxine daily, taking prenatal vitamin separately  PLAN:   She will continue 50 mcg of levothyroxine  Follow-up 6 months  Vicki Chaffin 06/19/2020, 3:19 PM    Note: This office note was prepared with Dragon voice recognition system technology. Any transcriptional errors that result from this process are unintentional.  This visit occurred during the SARS-CoV-2 public health emergency.  Safety protocols were in place, including screening questions prior to the visit, additional usage of staff PPE, and extensive cleaning of exam room while  observing appropriate contact time as indicated for disinfecting solutions.

## 2020-07-29 ENCOUNTER — Other Ambulatory Visit: Payer: Self-pay | Admitting: Endocrinology

## 2020-11-06 ENCOUNTER — Other Ambulatory Visit: Payer: Self-pay | Admitting: Endocrinology

## 2020-12-02 ENCOUNTER — Telehealth: Payer: Self-pay | Admitting: Endocrinology

## 2020-12-02 NOTE — Telephone Encounter (Signed)
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  States Dr Dwyane Dee has allowed this before.    Fax # [phone]  Call patient at [phone] for additional information or clarification

## 2020-12-02 NOTE — Telephone Encounter (Signed)
On your desk for signature

## 2020-12-03 NOTE — Telephone Encounter (Signed)
Patient called and advised that the faxed orders were received but what her lab at [facility] actually needs the [place] requisitions faxed so that they can draw them there and then send to [place].    Fax # [phone]  Call patient at [phone] for additional information or clarification

## 2020-12-04 ENCOUNTER — Other Ambulatory Visit: Payer: Self-pay

## 2020-12-04 DIAGNOSIS — E063 Autoimmune thyroiditis: Secondary | ICD-10-CM

## 2020-12-04 NOTE — Telephone Encounter (Signed)
Please have Janet Allen help you with this

## 2020-12-04 NOTE — Telephone Encounter (Signed)
[facility] faxed it to her.

## 2020-12-04 NOTE — Telephone Encounter (Signed)
Can you print these out please so I can fax them to her?

## 2020-12-04 NOTE — Addendum Note (Signed)
Addended by: Kaylyn Lim I on: 12/04/2020 12:12 PM   Modules accepted: Orders

## 2020-12-09 ENCOUNTER — Encounter: Payer: Self-pay | Admitting: *Deleted

## 2020-12-18 LAB — T4, FREE: Free T4: 1.24 ng/dL (ref 0.82–1.77)

## 2020-12-18 LAB — TSH: TSH: 0.538 u[IU]/mL (ref 0.450–4.500)

## 2020-12-22 ENCOUNTER — Other Ambulatory Visit: Payer: Managed Care, Other (non HMO)

## 2020-12-25 ENCOUNTER — Other Ambulatory Visit: Payer: Self-pay

## 2020-12-25 ENCOUNTER — Encounter: Payer: Self-pay | Admitting: Endocrinology

## 2020-12-25 ENCOUNTER — Ambulatory Visit: Payer: Managed Care, Other (non HMO) | Admitting: Endocrinology

## 2020-12-25 VITALS — BP 126/84 | HR 60 | Ht 65.0 in | Wt 178.0 lb

## 2020-12-25 DIAGNOSIS — E063 Autoimmune thyroiditis: Secondary | ICD-10-CM

## 2020-12-25 NOTE — Progress Notes (Signed)
Patient ID: Janet Allen, female   DOB: [birth year], 30 y.o.   MRN: [id number]           Referring Physician: Jonathon Jordan   Reason for Appointment:  Hypothyroidism, follow-up visit    History of Present Illness:   Hypothyroidism was first diagnosed in 11/2016  Patient had fatigue for a couple of years prior to her diagnosis and also some cold intolerance She said that she was having an  annual exam with her primary care physician in 1/18 and she was tested for multiple conditions because of her history of miscarriages.  Since the TSH was significantly high at 24 patient was started on levothyroxine 50 g daily  With this her energy level had improved  During her last pregnancy in 2018/19 her gynecologist was managing her levothyroxine dose and the dose was increased  Recent history: Her Synthroid dosage has been 50 mcg daily since her delivery for her first pregnancy in 2/19  She did not need any adjustment of her doses during pregnancy on her postpartum visit; her delivery was in late June 2021 No complaints of fatigue recently However has not been able to lose weight to her prepregnancy level  She is taking her levothyroxine consistently in the morning before breakfast around 5 AM She takes a general vitamin later in the day         Her thyroid levels show normal TSH, about the same as before at 0.54  Patient's weight history is as follows:  Wt Readings from Last 3 Encounters:  12/25/20 178 lb (80.7 kg)  06/19/20 175 lb (79.4 kg)  03/17/20 187 lb (84.8 kg)    Thyroid function results have been as follows:  Baseline TSH 23.7 done on 11/25/16  Lab Results  Component Value Date   TSH 0.538 12/17/2020   TSH 0.50 06/16/2020   TSH 0.66 03/10/2020   TSH 0.659 03/10/2020   FREET4 1.24 12/17/2020   FREET4 0.80 06/16/2020   FREET4 0.92 03/10/2020     Past Medical History:  Diagnosis Date  . Allergy   . Asthma   . Headache   . Heart murmur   . History of  recurrent UTIs   . Hyperthyroidism   . Pneumonia    hx  . PONV (postoperative nausea and vomiting)   . Tinnitus   . Vertigo   . Vitamin D deficiency     Past Surgical History:  Procedure Laterality Date  . BREAST BIOPSY Left 09/20/2013   Procedure: BREAST mass WITH NEEDLE LOCALIZATION;  Surgeon: Stark Klein, MD;  Location: [place];  Service: General;  Laterality: Left;  needle loc BCG 7:30  . BREAST BIOPSY Right 09/20/2013   Procedure: BREAST mass;  Surgeon: Stark Klein, MD;  Location: [place];  Service: General;  Laterality: Right;  . HERNIA REPAIR  1990  . MASS EXCISION Left 03/15/2013   Procedure: EXCISION OF TWO LEFT BREAST MASSES;  Surgeon: Stark Klein, MD;  Location: [place];  Service: General;  Laterality: Left;  . TYMPANOSTOMY TUBE PLACEMENT  1991  . WISDOM TOOTH EXTRACTION  2004    Family History  Problem Relation Age of Onset  . Hypertension Mother   . Depression Mother   . Anxiety disorder Sister   . Fibrocystic breast disease Maternal Grandmother   . Hypertension Paternal Grandmother   . Cancer Paternal Grandmother        stomach    Social History:  reports that she has never smoked. She has never used  smokeless tobacco. She reports that she does not drink alcohol and does not use drugs.  Allergies: No Known Allergies  Allergies as of 12/25/2020   No Known Allergies     Medication List       Accurate as of December 25, 2020  4:20 PM. If you have any questions, ask your nurse or doctor.        STOP taking these medications   PRENATAL VIT-FE FUMARATE-FA PO Stopped by: [name], MD     TAKE these medications   albuterol 108 (90 Base) MCG/ACT inhaler Commonly known as: VENTOLIN HFA Inhale 2 puffs into the lungs daily as needed for wheezing or shortness of breath.   levocetirizine 5 MG tablet Commonly known as: XYZAL Take 5 mg by mouth every evening.   levothyroxine 50 MCG tablet Commonly known as: SYNTHROID TAKE 1 TABLET BY MOUTH EVERY DAY    montelukast 10 MG tablet Commonly known as: SINGULAIR Take 10 mg by mouth at bedtime.          Review of Systems  She has history of asthma and allergic rhinitis        History of high prolactin level which was 51.1  in August 2017 Subsequently has been normal    Examination:    BP 126/84   Pulse 60   Ht 5\' 5"  (1.651 m)   Wt 178 lb (80.7 kg)   SpO2 99%   BMI 29.62 kg/m    Thyroid not palpable   Assessment:  HYPOTHYROIDISM primary, baseline TSH 23.7  She has been on a stable dose of 50 mcg of levothyroxine except when she she had her first pregnancy  Even though her weight has stayed up higher than prepregnancy level her levothyroxine requirement is about the same  Subjectively doing well and is taking her levothyroxine on empty stomach daily  TSH is 0.54 and about the same  PLAN:   She will continue 50 mcg of levothyroxine  Follow-up 6 months  [name] 12/25/2020, 4:20 PM    Note: This office note was prepared with Dragon voice recognition system technology. Any transcriptional errors that result from this process are unintentional.  This visit occurred during the SARS-CoV-2 public health emergency.  Safety protocols were in place, including screening questions prior to the visit, additional usage of staff PPE, and extensive cleaning of exam room while observing appropriate contact time as indicated for disinfecting solutions.

## 2021-02-04 ENCOUNTER — Other Ambulatory Visit: Payer: Self-pay | Admitting: Endocrinology

## 2021-04-18 ENCOUNTER — Other Ambulatory Visit: Payer: Self-pay | Admitting: Endocrinology

## 2021-06-10 ENCOUNTER — Other Ambulatory Visit: Payer: Self-pay

## 2021-06-10 DIAGNOSIS — E063 Autoimmune thyroiditis: Secondary | ICD-10-CM

## 2021-06-15 ENCOUNTER — Other Ambulatory Visit: Payer: Self-pay | Admitting: Endocrinology

## 2021-06-17 LAB — TSH: TSH: 0.98 u[IU]/mL (ref 0.450–4.500)

## 2021-06-18 LAB — SPECIMEN STATUS REPORT

## 2021-06-23 ENCOUNTER — Ambulatory Visit: Payer: Managed Care, Other (non HMO) | Admitting: Endocrinology

## 2021-06-23 ENCOUNTER — Other Ambulatory Visit: Payer: Self-pay

## 2021-06-23 ENCOUNTER — Encounter: Payer: Self-pay | Admitting: Endocrinology

## 2021-06-23 VITALS — BP 112/74 | HR 64 | Ht 60.5 in | Wt 171.2 lb

## 2021-06-23 DIAGNOSIS — E063 Autoimmune thyroiditis: Secondary | ICD-10-CM

## 2021-06-23 NOTE — Progress Notes (Signed)
Patient ID: Janet Allen, female   DOB: [birth year], 30 y.o.   MRN: [id number]           Referring Physician: Jonathon Jordan   Reason for Appointment:  Hypothyroidism, follow-up visit    History of Present Illness:   Hypothyroidism was first diagnosed in 11/2016  Patient had fatigue for a couple of years prior to her diagnosis and also some cold intolerance She said that she was having an  annual exam with her primary care physician in 11/2016 and she was tested for multiple conditions because of her history of miscarriages.  Since the TSH was significantly high at 24 patient was started on levothyroxine 50 g daily  With this her energy level had improved  During her pregnancy in 2018/19 her gynecologist was managing her levothyroxine dose and the dose was increased  Recent history:  Her Synthroid dosage has been 50 mcg daily for quite some time  She did not need any adjustment of her doses during her second pregnancy or her postpartum visit; her delivery was in late June 2021  She says she has been having fatigue since having COVID about 3 months ago and also some hair loss She has lost 7 pounds  She is taking her levothyroxine consistently in the morning before breakfast  Usually not taking vitamins or iron        Her thyroid levels show normal TSH, still less than 1.0  Patient's weight history is as follows:  Wt Readings from Last 3 Encounters:  06/23/21 171 lb 3.2 oz (77.7 kg)  12/25/20 178 lb (80.7 kg)  06/19/20 175 lb (79.4 kg)    Thyroid function results have been as follows:  Baseline TSH 23.7 done on 11/25/16  Lab Results  Component Value Date   TSH 0.980 06/15/2021   TSH 0.538 12/17/2020   TSH 0.50 06/16/2020   FREET4 1.24 12/17/2020   FREET4 0.80 06/16/2020   FREET4 0.92 03/10/2020     Past Medical History:  Diagnosis Date   Allergy    Asthma    Headache    Heart murmur    History of recurrent UTIs    Hyperthyroidism    Pneumonia    hx    PONV (postoperative nausea and vomiting)    Tinnitus    Vertigo    Vitamin D deficiency     Past Surgical History:  Procedure Laterality Date   BREAST BIOPSY Left 09/20/2013   Procedure: BREAST mass WITH NEEDLE LOCALIZATION;  Surgeon: Stark Klein, MD;  Location: [place];  Service: General;  Laterality: Left;  needle loc BCG 7:30   BREAST BIOPSY Right 09/20/2013   Procedure: BREAST mass;  Surgeon: Stark Klein, MD;  Location: [place];  Service: General;  Laterality: Right;   HERNIA REPAIR  1990   MASS EXCISION Left 03/15/2013   Procedure: EXCISION OF TWO LEFT BREAST MASSES;  Surgeon: Stark Klein, MD;  Location: [place];  Service: General;  Laterality: Left;   TYMPANOSTOMY [place] EXTRACTION  2004    Family History  Problem Relation Age of Onset   Hypertension Mother    Depression Mother    Anxiety disorder Sister    Fibrocystic breast disease Maternal Grandmother    Hypertension Paternal Grandmother    Cancer Paternal Grandmother        stomach    Social History:  reports that she has never smoked. She has never used smokeless tobacco. She reports that she does  not drink alcohol and does not use drugs.  Allergies: No Known Allergies  Allergies as of 06/23/2021   No Known Allergies      Medication List        Accurate as of June 23, 2021  8:16 AM. If you have any questions, ask your nurse or doctor.          albuterol 108 (90 Base) MCG/ACT inhaler Commonly known as: VENTOLIN HFA Inhale 2 puffs into the lungs daily as needed for wheezing or shortness of breath.   levocetirizine 5 MG tablet Commonly known as: XYZAL Take 5 mg by mouth every evening.   levothyroxine 50 MCG tablet Commonly known as: SYNTHROID TAKE 1 TABLET BY MOUTH EVERY DAY   montelukast 10 MG tablet Commonly known as: SINGULAIR Take 10 mg by mouth at bedtime.           Review of Systems  She has history of asthma and allergic rhinitis        History of  high prolactin level which was 51.1  in August 2017 Subsequently has been normal  She just came off birth control pills and is getting regular menstrual cycles    Examination:    BP 112/74   Pulse 64   Ht 5' 0.5" (1.537 m)   Wt 171 lb 3.2 oz (77.7 kg)   SpO2 99%   BMI 32.88 kg/m    Thyroid not palpable Hands not unusually warm Biceps reflexes show normal relaxation   Assessment:  HYPOTHYROIDISM primary, baseline TSH 23.7  She has been on a stable dose of 50 mcg of levothyroxine except when she she had her first pregnancy  Since her second pregnancy in June 2021 she has not needed any adjustment of her thyroid supplement She has fatigue related to recent COVID otherwise doing well  As before she takes her levothyroxine on empty stomach every morning  TSH is very normal again No thyroid enlargement  PLAN:   She will continue 50 mcg of levothyroxine  Follow-up 12 months  Janet Allen 06/23/2021, 8:16 AM    Note: This office note was prepared with Dragon voice recognition system technology. Any transcriptional errors that result from this process are unintentional.  This visit occurred during the SARS-CoV-2 public health emergency.  Safety protocols were in place, including screening questions prior to the visit, additional usage of staff PPE, and extensive cleaning of exam room while observing appropriate contact time as indicated for disinfecting solutions.

## 2021-07-20 ENCOUNTER — Other Ambulatory Visit: Payer: Self-pay | Admitting: Allergy and Immunology

## 2021-07-20 ENCOUNTER — Ambulatory Visit
Admission: RE | Admit: 2021-07-20 | Discharge: 2021-07-20 | Disposition: A | Discharge status: Home / Self Care | Payer: Managed Care, Other (non HMO) | Source: Ambulatory Visit | Attending: Allergy and Immunology | Admitting: Allergy and Immunology

## 2021-07-20 DIAGNOSIS — J4541 Moderate persistent asthma with (acute) exacerbation: Secondary | ICD-10-CM

## 2021-12-31 ENCOUNTER — Other Ambulatory Visit: Payer: Self-pay | Admitting: Family Medicine

## 2021-12-31 DIAGNOSIS — D242 Benign neoplasm of left breast: Secondary | ICD-10-CM

## 2021-12-31 DIAGNOSIS — N63 Unspecified lump in unspecified breast: Secondary | ICD-10-CM

## 2022-01-26 ENCOUNTER — Ambulatory Visit
Admission: RE | Admit: 2022-01-26 | Discharge: 2022-01-26 | Disposition: A | Discharge status: Home / Self Care | Payer: Managed Care, Other (non HMO) | Source: Ambulatory Visit | Attending: Family Medicine | Admitting: Family Medicine

## 2022-01-26 ENCOUNTER — Other Ambulatory Visit: Payer: Self-pay | Admitting: Family Medicine

## 2022-01-26 DIAGNOSIS — D242 Benign neoplasm of left breast: Secondary | ICD-10-CM

## 2022-01-26 DIAGNOSIS — N63 Unspecified lump in unspecified breast: Secondary | ICD-10-CM

## 2022-06-15 ENCOUNTER — Other Ambulatory Visit (INDEPENDENT_AMBULATORY_CARE_PROVIDER_SITE_OTHER): Payer: Managed Care, Other (non HMO)

## 2022-06-15 DIAGNOSIS — E063 Autoimmune thyroiditis: Secondary | ICD-10-CM

## 2022-06-15 LAB — T4, FREE: Free T4: 0.77 ng/dL (ref 0.60–1.60)

## 2022-06-15 LAB — TSH: TSH: 1.63 u[IU]/mL (ref 0.35–5.50)

## 2022-06-18 ENCOUNTER — Other Ambulatory Visit: Payer: Managed Care, Other (non HMO)

## 2022-06-22 ENCOUNTER — Encounter: Payer: Self-pay | Admitting: Endocrinology

## 2022-06-22 ENCOUNTER — Ambulatory Visit: Payer: Managed Care, Other (non HMO) | Admitting: Endocrinology

## 2022-06-22 VITALS — BP 124/82 | HR 61 | Ht 65.0 in | Wt 162.8 lb

## 2022-06-22 DIAGNOSIS — E038 Other specified hypothyroidism: Secondary | ICD-10-CM | POA: Diagnosis not present

## 2022-06-22 MED ORDER — LEVOTHYROXINE SODIUM 25 MCG PO TABS: 25.0000 ug | ORAL_TABLET | Freq: Every day | ORAL | 3 refills | Status: DC

## 2022-06-22 NOTE — Progress Notes (Signed)
Patient ID: Janet Allen, female   DOB: 06-07-1991, 31 y.o.   MRN: [id number]           Referring Physician: Jonathon Jordan   Reason for Appointment:  Hypothyroidism, follow-up visit    History of Present Illness:   Hypothyroidism was first diagnosed in 11/2016  Patient had fatigue for a couple of years prior to her diagnosis and also some cold intolerance She said that she was having an  annual exam with her primary care physician in 11/2016 and she was tested for multiple conditions because of her history of miscarriages.  Since the TSH was significantly high at 24 patient was started on levothyroxine 50 g daily  With this her energy level had improved  During her pregnancy in 2018/19 her gynecologist was managing her levothyroxine dose and the dose was increased  Recent history:  Her Synthroid dosage has been 50 mcg daily for quite some time  She did not need any adjustment of her doses during her second pregnancy or her postpartum visit Subsequently has been continued on 50 mcg including on her last visit in August 2 need to  On her own she has not taken any levothyroxine since late last year as she thought we were going to taper off the medication Only recently has started having some fatigue, some hair loss and cold intolerance Has lost weight        Her thyroid levels show normal TSH, 1.6 compared to about 1 Free T4 relatively lower but still normal and comparable to levels 3 years ago  Patient's weight history is as follows:  Wt Readings from Last 3 Encounters:  06/22/22 162 lb 12.8 oz (73.8 kg)  06/23/21 171 lb 3.2 oz (77.7 kg)  12/25/20 178 lb (80.7 kg)    Thyroid function results have been as follows:  Baseline TSH 23.7 done on 11/25/16  Lab Results  Component Value Date   TSH 1.63 06/15/2022   TSH 0.980 06/15/2021   TSH 0.538 12/17/2020   FREET4 0.77 06/15/2022   FREET4 1.24 12/17/2020   FREET4 0.80 06/16/2020     Past Medical History:   Diagnosis Date   Allergy    Asthma    Headache    Heart murmur    History of recurrent UTIs    Hyperthyroidism    Pneumonia    hx   PONV (postoperative nausea and vomiting)    Tinnitus    Vertigo    Vitamin D deficiency     Past Surgical History:  Procedure Laterality Date   BREAST BIOPSY Left 09/20/2013   Procedure: BREAST mass WITH NEEDLE LOCALIZATION;  Surgeon: Stark Klein, MD;  Location: [place];  Service: General;  Laterality: Left;  needle loc BCG 7:30   BREAST BIOPSY Right 09/20/2013   Procedure: BREAST mass;  Surgeon: Stark Klein, MD;  Location: [place];  Service: General;  Laterality: Right;   HERNIA REPAIR  1990   MASS EXCISION Left 03/15/2013   Procedure: EXCISION OF TWO LEFT BREAST MASSES;  Surgeon: Stark Klein, MD;  Location: [place];  Service: General;  Laterality: Left;   TYMPANOSTOMY [place] EXTRACTION  2004    Family History  Problem Relation Age of Onset   Hypertension Mother    Depression Mother    Anxiety disorder Sister    Fibrocystic breast disease Maternal Grandmother    Hypertension Paternal Grandmother    Cancer Paternal Grandmother  stomach    Social History:  reports that she has never smoked. She has never used smokeless tobacco. She reports that she does not drink alcohol and does not use drugs.  Allergies: No Known Allergies  Allergies as of 06/22/2022   No Known Allergies      Medication List        Accurate as of June 22, 2022  9:06 AM. If you have any questions, ask your nurse or doctor.          albuterol 108 (90 Base) MCG/ACT inhaler Commonly known as: VENTOLIN HFA Inhale 2 puffs into the lungs daily as needed for wheezing or shortness of breath.   Claritin 10 MG tablet Generic drug: loratadine 1 tablet   desloratadine 5 MG tablet Commonly known as: CLARINEX Take 5 mg by mouth daily.   levothyroxine 50 MCG tablet Commonly known as: SYNTHROID TAKE 1 TABLET BY MOUTH EVERY DAY    montelukast 10 MG tablet Commonly known as: SINGULAIR Take 10 mg by mouth at bedtime.   Trelegy Ellipta 200-62.5-25 MCG/ACT Aepb Generic drug: Fluticasone-Umeclidin-Vilant INHALE 1 PUFF BY MOUTH EVERY DAY           Review of Systems  She has history of asthma and allergic rhinitis       History of high prolactin level which was 51.1  in August 2017 Subsequently has been normal  She is getting regular menstrual cycles, off birth control since summer 2022    Examination:    BP 124/82   Pulse 61   Ht '5\' 5"'$  (1.651 m)   Wt 162 lb 12.8 oz (73.8 kg)   SpO2 99%   BMI 27.09 kg/m    Thyroid not palpable Skin looks normal Alopecia not present Biceps reflexes show normal relaxation   Assessment:  HYPOTHYROIDISM primary, baseline TSH 23.7  She previously had been on a stable dose of 50 mcg of levothyroxine except when she she had her first pregnancy  She has stopped her levothyroxine on her own several months ago However is starting to have fatigue recently along with mild cold intolerance and hair loss Although it still appears that her thyroid levels are normal she feels like she wants to try levothyroxine again  No thyroid enlargement or other abnormality on exam  PLAN:   She will be given a trial of 25 mcg of levothyroxine If fatigue continues she will see her PCP  Follow-up labs in 6 weeks and regular follow-up in 4 months  [name] Dwyane Dee 06/22/2022, 9:06 AM    Note: This office note was prepared with Dragon voice recognition system technology. Any transcriptional errors that result from this process are unintentional.

## 2022-06-22 NOTE — Progress Notes (Signed)
a 

## 2022-08-03 ENCOUNTER — Other Ambulatory Visit (INDEPENDENT_AMBULATORY_CARE_PROVIDER_SITE_OTHER): Payer: Managed Care, Other (non HMO)

## 2022-08-03 ENCOUNTER — Ambulatory Visit
Admission: RE | Admit: 2022-08-03 | Discharge: 2022-08-03 | Disposition: A | Discharge status: Home / Self Care | Payer: Managed Care, Other (non HMO) | Source: Ambulatory Visit | Attending: Family Medicine | Admitting: Family Medicine

## 2022-08-03 ENCOUNTER — Other Ambulatory Visit: Payer: Self-pay | Admitting: Family Medicine

## 2022-08-03 DIAGNOSIS — N63 Unspecified lump in unspecified breast: Secondary | ICD-10-CM

## 2022-08-03 DIAGNOSIS — E038 Other specified hypothyroidism: Secondary | ICD-10-CM | POA: Diagnosis not present

## 2022-08-03 LAB — TSH: TSH: 1.19 u[IU]/mL (ref 0.35–5.50)

## 2022-08-03 LAB — T4, FREE: Free T4: 0.97 ng/dL (ref 0.60–1.60)

## 2022-08-03 LAB — T3, FREE: T3, Free: 3.3 pg/mL (ref 2.3–4.2)

## 2022-08-19 ENCOUNTER — Encounter: Payer: Self-pay | Admitting: Endocrinology

## 2022-10-12 ENCOUNTER — Other Ambulatory Visit: Payer: Self-pay | Admitting: Endocrinology

## 2022-10-26 ENCOUNTER — Other Ambulatory Visit: Payer: Managed Care, Other (non HMO)

## 2022-10-28 ENCOUNTER — Other Ambulatory Visit: Payer: Self-pay | Admitting: Endocrinology

## 2022-10-28 DIAGNOSIS — E038 Other specified hypothyroidism: Secondary | ICD-10-CM

## 2022-10-29 ENCOUNTER — Other Ambulatory Visit (INDEPENDENT_AMBULATORY_CARE_PROVIDER_SITE_OTHER): Payer: Managed Care, Other (non HMO)

## 2022-10-29 DIAGNOSIS — E038 Other specified hypothyroidism: Secondary | ICD-10-CM

## 2022-10-29 LAB — TSH: TSH: 0.8 u[IU]/mL (ref 0.35–5.50)

## 2022-10-29 LAB — T4, FREE: Free T4: 1.01 ng/dL (ref 0.60–1.60)

## 2022-11-02 ENCOUNTER — Ambulatory Visit: Payer: Managed Care, Other (non HMO) | Admitting: Endocrinology

## 2022-11-02 ENCOUNTER — Encounter: Payer: Self-pay | Admitting: Endocrinology

## 2022-11-02 VITALS — BP 118/80 | HR 93 | Ht 65.0 in | Wt 164.6 lb

## 2022-11-02 DIAGNOSIS — E038 Other specified hypothyroidism: Secondary | ICD-10-CM | POA: Diagnosis not present

## 2022-11-02 NOTE — Progress Notes (Signed)
Patient ID: Janet Allen, female   DOB: 05-Aug-1991, 32 y.o.   MRN: [id number]           Primary CARE physician: Jonathon Jordan   Reason for Appointment:  Hypothyroidism, follow-up visit    History of Present Illness:   Hypothyroidism was first diagnosed in 11/2016  Patient had fatigue for a couple of years prior to her diagnosis and also some cold intolerance She said that she was having an  annual exam with her primary care physician in 11/2016 and she was tested for multiple conditions because of her history of miscarriages.  Since the TSH was significantly high at 24 patient was started on levothyroxine 50 g daily  With this her energy level had improved  During her pregnancy in 2018/19 her gynecologist was managing her levothyroxine dose and the dose was increased  Recent history:  Her Synthroid dosage previously been 50 mcg daily for quite some time  On her own she had not taken any levothyroxine for a few months prior to her last visit in 8/23 She did not feel any different for some time but right before her last visit she was having  some fatigue, some hair loss and cold intolerance  Although her thyroid levels were still normal she has been tried on empirical doses of 25 mcg levothyroxine for the last 4 months She thinks that about 3 weeks after medication started she started feeling better with her energy level  Has lost weight, may be slightly better with her hair loss        Her thyroid levels show normal TSH at 0.8 now Free T4 appears improved  Patient's weight history is as follows:  Wt Readings from Last 3 Encounters:  11/02/22 164 lb 9.6 oz (74.7 kg)  06/22/22 162 lb 12.8 oz (73.8 kg)  06/23/21 171 lb 3.2 oz (77.7 kg)    Thyroid function results have been as follows:  Baseline TSH 23.7 done on 11/25/16  Lab Results  Component Value Date   TSH 0.80 10/29/2022   TSH 1.19 08/03/2022   TSH 1.63 06/15/2022   FREET4 1.01 10/29/2022   FREET4 0.97  08/03/2022   FREET4 0.77 06/15/2022     Past Medical History:  Diagnosis Date   Allergy    Asthma    Headache    Heart murmur    History of recurrent UTIs    Hyperthyroidism    Pneumonia    hx   PONV (postoperative nausea and vomiting)    Tinnitus    Vertigo    Vitamin D deficiency     Past Surgical History:  Procedure Laterality Date   BREAST BIOPSY Left 09/20/2013   Procedure: BREAST mass WITH NEEDLE LOCALIZATION;  Surgeon: Stark Klein, MD;  Location: [place];  Service: General;  Laterality: Left;  needle loc BCG 7:30   BREAST BIOPSY Right 09/20/2013   Procedure: BREAST mass;  Surgeon: Stark Klein, MD;  Location: [place];  Service: General;  Laterality: Right;   HERNIA REPAIR  1990   MASS EXCISION Left 03/15/2013   Procedure: EXCISION OF TWO LEFT BREAST MASSES;  Surgeon: Stark Klein, MD;  Location: [place];  Service: General;  Laterality: Left;   TYMPANOSTOMY [place] EXTRACTION  2004    Family History  Problem Relation Age of Onset   Hypertension Mother    Depression Mother    Anxiety disorder Sister    Breast cancer Maternal Grandmother    Fibrocystic  breast disease Maternal Grandmother    Hypertension Paternal Grandmother    Cancer Paternal Grandmother        stomach   Diabetes Paternal Grandfather    Thyroid disease Neg Hx     Social History:  reports that she has never smoked. She has never used smokeless tobacco. She reports that she does not drink alcohol and does not use drugs.  Allergies: No Known Allergies  Allergies as of 11/02/2022   No Known Allergies      Medication List        Accurate as of November 02, 2022  9:18 AM. If you have any questions, ask your nurse or doctor.          albuterol 108 (90 Base) MCG/ACT inhaler Commonly known as: VENTOLIN HFA Inhale 2 puffs into the lungs daily as needed for wheezing or shortness of breath.   Claritin 10 MG tablet Generic drug: loratadine 1 tablet   desloratadine 5  MG tablet Commonly known as: CLARINEX Take 5 mg by mouth daily.   levothyroxine 25 MCG tablet Commonly known as: SYNTHROID TAKE 1 TABLET BY MOUTH DAILY BEFORE BREAKFAST.   montelukast 10 MG tablet Commonly known as: SINGULAIR Take 10 mg by mouth at bedtime.   Trelegy Ellipta 200-62.5-25 MCG/ACT Aepb Generic drug: Fluticasone-Umeclidin-Vilant INHALE 1 PUFF BY MOUTH EVERY DAY           Review of Systems  She has history of asthma and allergic rhinitis       History of high prolactin level which was 51.1  in August 2017 Subsequently has been normal  She is getting regular menstrual cycles, off birth control since summer 2022    Examination:    BP 118/80 (BP Location: Left Arm, Patient Position: Sitting, Cuff Size: Normal)   Pulse 93   Ht '5\' 5"'$  (1.651 m)   Wt 164 lb 9.6 oz (74.7 kg)   SpO2 97%   BMI 27.39 kg/m    Thyroid not palpable    Assessment:  HYPOTHYROIDISM primary, baseline TSH 23.7  She previously had been on a stable dose of 50 mcg of levothyroxine except when she she had her first pregnancy  She is back on levothyroxine 25 mcg daily since August 2023 Even though her TSH was normal she is subjectively doing better with supplementation Not clear if she may have a element of secondary hypothyroidism since her free T4 was low normal previously Thyroid not palpable    PLAN:   She will continue on her levothyroxine 25 mcg and follow-up in a year unless having more fatigue  [name] 11/02/2022, 9:18 AM    Note: This office note was prepared with Dragon voice recognition system technology. Any transcriptional errors that result from this process are unintentional.

## 2023-01-28 ENCOUNTER — Other Ambulatory Visit: Payer: Managed Care, Other (non HMO)

## 2023-02-01 ENCOUNTER — Other Ambulatory Visit: Payer: Self-pay | Admitting: Family Medicine

## 2023-02-01 ENCOUNTER — Encounter: Payer: Self-pay | Admitting: Family Medicine

## 2023-02-01 ENCOUNTER — Ambulatory Visit
Admission: RE | Admit: 2023-02-01 | Discharge: 2023-02-01 | Disposition: A | Discharge status: Home / Self Care | Payer: Managed Care, Other (non HMO) | Source: Ambulatory Visit | Attending: Family Medicine | Admitting: Family Medicine

## 2023-02-01 DIAGNOSIS — N6310 Unspecified lump in the right breast, unspecified quadrant: Secondary | ICD-10-CM

## 2023-02-01 DIAGNOSIS — N63 Unspecified lump in unspecified breast: Secondary | ICD-10-CM

## 2023-02-15 ENCOUNTER — Ambulatory Visit
Admission: RE | Admit: 2023-02-15 | Discharge: 2023-02-15 | Disposition: A | Discharge status: Home / Self Care | Payer: Managed Care, Other (non HMO) | Source: Ambulatory Visit | Attending: Family Medicine | Admitting: Family Medicine

## 2023-02-15 ENCOUNTER — Other Ambulatory Visit: Payer: Self-pay | Admitting: Family Medicine

## 2023-02-15 DIAGNOSIS — N6310 Unspecified lump in the right breast, unspecified quadrant: Secondary | ICD-10-CM

## 2023-02-15 HISTORY — PX: BREAST BIOPSY: SHX20

## 2023-02-23 ENCOUNTER — Other Ambulatory Visit: Payer: Self-pay | Admitting: Endocrinology

## 2023-08-31 ENCOUNTER — Other Ambulatory Visit: Payer: Self-pay

## 2023-08-31 ENCOUNTER — Telehealth: Payer: Self-pay | Admitting: Endocrinology

## 2023-08-31 DIAGNOSIS — E038 Other specified hypothyroidism: Secondary | ICD-10-CM

## 2023-08-31 MED ORDER — LEVOTHYROXINE SODIUM 25 MCG PO TABS: ORAL_TABLET | ORAL | 1 refills | Status: DC

## 2023-08-31 NOTE — Telephone Encounter (Signed)
Levothyroxine refill request complete

## 2023-08-31 NOTE — Telephone Encounter (Signed)
MEDICATION: Levothyroxine  PHARMACY:  [facility] in [facility]  HAS THE PATIENT CONTACTED THEIR PHARMACY?  YES  IS THIS A 90 DAY SUPPLY : YES  IS PATIENT OUT OF MEDICATION: YES  IF NOT; HOW MUCH IS LEFT:   LAST APPOINTMENT DATE: @01 /12/2022  NEXT APPOINTMENT DATE:@1 /05/2024  DO WE HAVE YOUR PERMISSION TO LEAVE A DETAILED MESSAGE?: YES  OTHER COMMENTS:    **Let patient know to contact pharmacy at the end of the day to make sure medication is ready. **  ** Please notify patient to allow 48-72 hours to process**  **Encourage patient to contact the pharmacy for refills or they can request refills through [facility]**

## 2023-10-20 ENCOUNTER — Encounter: Payer: Self-pay | Admitting: Endocrinology

## 2023-10-20 DIAGNOSIS — E063 Autoimmune thyroiditis: Secondary | ICD-10-CM

## 2023-10-21 NOTE — Telephone Encounter (Signed)
I have placed order for TSH, free T4, okay to complete lab as patient requested in outside laboratory.  Please send the paper prescription to the patient.

## 2023-11-03 ENCOUNTER — Other Ambulatory Visit: Payer: Self-pay

## 2023-11-08 ENCOUNTER — Other Ambulatory Visit: Payer: Managed Care, Other (non HMO)

## 2023-11-10 LAB — T4, FREE: Free T4: 1.35 ng/dL (ref 0.82–1.77)

## 2023-11-10 LAB — SPECIMEN STATUS REPORT

## 2023-11-10 LAB — TSH: TSH: 1.06 u[IU]/mL (ref 0.450–4.500)

## 2023-11-15 ENCOUNTER — Encounter: Payer: Self-pay | Admitting: Endocrinology

## 2023-11-15 ENCOUNTER — Ambulatory Visit: Payer: Managed Care, Other (non HMO) | Admitting: Endocrinology

## 2023-11-15 VITALS — BP 124/80 | HR 70 | Resp 20 | Ht 65.0 in | Wt 157.6 lb

## 2023-11-15 DIAGNOSIS — E063 Autoimmune thyroiditis: Secondary | ICD-10-CM

## 2023-11-15 DIAGNOSIS — E038 Other specified hypothyroidism: Secondary | ICD-10-CM | POA: Diagnosis not present

## 2023-11-15 MED ORDER — LEVOTHYROXINE SODIUM 25 MCG PO TABS: ORAL_TABLET | ORAL | 3 refills | Status: DC

## 2023-11-15 NOTE — Progress Notes (Signed)
 Outpatient Endocrinology Note Rukaya Kleinschmidt, MD   Patient's Name: Janet Allen    DOB: June 23, 1991    MRN: [id number]  REASON OF VISIT: Follow-up for hypothyroidism  PCP: Verena Mems, MD  HISTORY OF PRESENT ILLNESS:   Janet Allen is a 33 y.o. old female with past medical history as listed below is presented for a follow up for hypothyroidism.   Pertinent Thyroid  History: Patient was previously seen by Dr. Von and was last time seen in January 2024. Patient was diagnosed with hypothyroidism in January 2018.  She had symptoms of fatigue and cold intolerance at the time of diagnosis.  TSH was 24 at the time of diagnosis.  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  She was restarted on levothyroxine  initial dose was 50 mcg daily.  On her own she had not taken any levothyroxine  for a few months prior to her,visit in 8/23. She did not feel any different for some time but right before her last visit she was having  some fatigue, some hair loss and cold intolerance. Although her thyroid  levels were still normal she has been tried on empirical doses of 25 mcg levothyroxine  for the last 4 months. She thinks that about 3 weeks after medication started she started feeling better with her energy level . Has lost weight, may be slightly better with her hair loss.  She has been on levothyroxine  25 mcg daily.   Interval history  Patient has been taking levothyroxine  25 mcg daily.  She denies palpitation or heat intolerance.  She has fatigue but has been about the same way for a long time.  Denies bowel movement problem.  No cold intolerance.  No hypo and hyperthyroid symptoms.  Recent thyroid  lab normal as follows.    Latest Reference Range & Units 11/09/23 00:00  TSH 0.450 - 4.500 uIU/mL 1.060  T4,Free(Direct) 0.82 - 1.77 ng/dL 8.64     REVIEW OF SYSTEMS:  As per history of present illness.   PAST MEDICAL HISTORY: Past Medical History:  Diagnosis Date    Allergy    Asthma    Headache    Heart murmur    History of recurrent UTIs    Hyperthyroidism    Pneumonia    hx   PONV (postoperative nausea and vomiting)    Tinnitus    Vertigo    Vitamin D deficiency     PAST SURGICAL HISTORY: Past Surgical History:  Procedure Laterality Date   BREAST BIOPSY Left 09/20/2013   Procedure: BREAST mass WITH NEEDLE LOCALIZATION;  Surgeon: Jina Nephew, MD;  [facility];  Service: General;  Laterality: Left;  needle loc BCG 7:30   BREAST BIOPSY Right 09/20/2013   Procedure: BREAST mass;  Surgeon: Jina Nephew, MD;  [facility];  Service: General;  Laterality: Right;   BREAST BIOPSY Right 02/15/2023   US  RT BREAST BX W LOC DEV 1ST LESION IMG BX SPEC US  GUIDE 02/15/2023 [facility] MAMMOGRAPHY   HERNIA REPAIR  1990   MASS EXCISION Left 03/15/2013   Procedure: EXCISION OF TWO LEFT BREAST MASSES;  Surgeon: Jina Nephew, MD;  Location[facility];  Service: General;  Laterality: Left;   TYMPANOSTOMY TUBE PLACEMENT  1991   WISDOM TOOTH EXTRACTION  2004    ALLERGIES: No Known Allergies  FAMILY HISTORY:  Family History  Problem Relation Age of Onset   Hypertension Mother    Depression Mother    Anxiety disorder Sister    Breast cancer Maternal Grandmother  Fibrocystic breast disease Maternal Grandmother    Hypertension Paternal Grandmother    Cancer Paternal Grandmother        stomach   Diabetes Paternal Grandfather    Thyroid  disease Neg Hx     SOCIAL HISTORY: Social History   Socioeconomic History   Marital status: Married    Spouse name: Not on file   Number of children: Not on file   Years of education: Not on file   Highest education level: Not on file  Occupational History   Not on file  Tobacco Use   Smoking status: Never   Smokeless tobacco: Never  Substance and Sexual Activity   Alcohol use: No    Alcohol/week: 0.0 standard drinks of alcohol   Drug use: No   Sexual activity: Yes    Partners: Male    Birth  control/protection: Condom  Other Topics Concern   Not on file  Social History Narrative   Not on file   Social Drivers of Health   Financial Resource Strain: Low Risk  (04/25/2023)   Received from [facility]   Overall Financial Resource Strain (CARDIA)    Difficulty of Paying Living Expenses: Not hard at all  Food Insecurity: No Food Insecurity (04/25/2023)   Received from [facility]   Hunger Vital Sign    Worried About Running Out of Food in the Last Year: Never true    Ran Out of Food in the Last Year: Never true  Transportation Needs: No Transportation Needs (04/25/2023)   Received from [facility] - Transportation    Lack of Transportation (Medical): No    Lack of Transportation (Non-Medical): No  Physical Activity: Insufficiently Active (04/25/2023)   Received from [facility]   Exercise Vital Sign    Days of Exercise per Week: 2 days    Minutes of Exercise per Session: 30 min  Stress: No Stress Concern Present (04/25/2023)   Received from [facility] - Occupational Stress Questionnaire    Feeling of Stress : Not at all  Social Connections: Socially Integrated (04/25/2023)   Received from [facility]   Social Network    How would you rate your social network (family, work, friends)?: Good participation with social networks    MEDICATIONS:  Current Outpatient Medications  Medication Sig Dispense Refill   albuterol (PROVENTIL HFA;VENTOLIN HFA) 108 (90 BASE) MCG/ACT inhaler Inhale 2 puffs into the lungs daily as needed for wheezing or shortness of breath.     desloratadine (CLARINEX) 5 MG tablet Take 5 mg by mouth daily.     Fluticasone-Umeclidin-Vilant (TRELEGY ELLIPTA) 200-62.5-25 MCG/ACT AEPB INHALE 1 PUFF BY MOUTH EVERY DAY     loratadine (CLARITIN) 10 MG tablet 1 tablet     montelukast (SINGULAIR) 10 MG tablet Take 10 mg by mouth at bedtime.     levothyroxine  (SYNTHROID ) 25 MCG tablet Take 1 tab by mouth  daily before breakfast 90 tablet 3   No current facility-administered medications for this visit.    PHYSICAL EXAM: Vitals:   11/15/23 0847  BP: 124/80  Pulse: 70  Resp: 20  SpO2: 99%  Weight: 157 lb 9.6 oz (71.5 kg)  Height: 5' 5 (1.651 m)   Body mass index is 26.23 kg/m.  Wt Readings from Last 3 Encounters:  11/15/23 157 lb 9.6 oz (71.5 kg)  11/02/22 164 lb 9.6 oz (74.7 kg)  06/22/22 162 lb 12.8 oz (73.8 kg)    General: Well developed,  well nourished female in no apparent distress.  HEENT: AT/[place], no external lesions. Hearing intact to the spoken word Eyes: Conjunctiva clear and no icterus. Neck: Trachea midline, neck supple without appreciable thyromegaly or lymphadenopathy and no palpable thyroid  nodules Lungs: Clear to auscultation, no wheeze. Respirations not labored Heart: S1S2, Regular in rate and rhythm. No loud murmurs Abdomen: Soft, non tender, non distended Neurologic: Alert, oriented, normal speech, deep tendon biceps reflexes normal,  no gross focal neurological deficit Extremities: No pedal pitting edema, no tremors of outstretched hands Skin: Warm, color good.  Psychiatric: Does not appear depressed or anxious  PERTINENT HISTORIC LABORATORY AND IMAGING STUDIES:  All pertinent laboratory results were reviewed. Please see HPI also for further details.   TSH  Date Value Ref Range Status  11/09/2023 1.060 0.450 - 4.500 uIU/mL Final  10/29/2022 0.80 0.35 - 5.50 uIU/mL Final  08/03/2022 1.19 0.35 - 5.50 uIU/mL Final     ASSESSMENT / PLAN  1. Hypothyroidism due to Hashimoto's thyroiditis   2. Adult onset hypothyroidism    -Patient has been taking levothyroxine  25 mcg daily.  She is clinically and biochemically euthyroid.  Plan: -Continue current dose of levothyroxine  25 mcg daily. -Annual endocrinology follow-up.   Diagnoses and all orders for this visit:  Hypothyroidism due to Hashimoto's thyroiditis -     T4, free -     TSH  Adult onset  hypothyroidism -     levothyroxine  (SYNTHROID ) 25 MCG tablet; Take 1 tab by mouth daily before breakfast    DISPOSITION Follow up in clinic in 12 months suggested.  Labs prior to visit.  All questions answered and patient verbalized understanding of the plan.  Melodye Swor, MD [facility] Endocrinology [facility] [address] [place], [address] [place], [place] [address] Phone # [phone]  At least part of this note was generated using voice recognition software. Inadvertent word errors may have occurred, which were not recognized during the proofreading process.

## 2023-12-15 ENCOUNTER — Other Ambulatory Visit: Payer: Self-pay | Admitting: Family Medicine

## 2023-12-15 ENCOUNTER — Ambulatory Visit
Admission: RE | Admit: 2023-12-15 | Discharge: 2023-12-15 | Disposition: A | Discharge status: Home / Self Care | Payer: Managed Care, Other (non HMO) | Source: Ambulatory Visit | Attending: Family Medicine | Admitting: Family Medicine

## 2023-12-15 DIAGNOSIS — J189 Pneumonia, unspecified organism: Secondary | ICD-10-CM

## 2024-01-03 ENCOUNTER — Other Ambulatory Visit: Payer: Self-pay | Admitting: Family Medicine

## 2024-01-03 DIAGNOSIS — N6320 Unspecified lump in the left breast, unspecified quadrant: Secondary | ICD-10-CM

## 2024-01-03 DIAGNOSIS — Z09 Encounter for follow-up examination after completed treatment for conditions other than malignant neoplasm: Secondary | ICD-10-CM

## 2024-02-06 ENCOUNTER — Encounter: Payer: Self-pay | Admitting: Endocrinology

## 2024-02-06 ENCOUNTER — Telehealth: Payer: Self-pay

## 2024-02-06 DIAGNOSIS — E038 Other specified hypothyroidism: Secondary | ICD-10-CM

## 2024-02-06 DIAGNOSIS — E063 Autoimmune thyroiditis: Secondary | ICD-10-CM

## 2024-02-06 MED ORDER — LEVOTHYROXINE SODIUM 50 MCG PO TABS: 50.0000 ug | ORAL_TABLET | Freq: Every day | ORAL | 3 refills | Status: DC

## 2024-02-06 NOTE — Telephone Encounter (Signed)
 I have increased levothyroxine from 25 to 50 mcg daily.  Sent new prescription.  She needs to have thyroid lab TSH, free T4 in 2 months.  Please arrange for lab visit, I have placed the lab order.

## 2024-02-06 NOTE — Telephone Encounter (Signed)
 Patient called to make aware of medication change as well as transferred to front desk to schedule lab appointment.

## 2024-02-21 ENCOUNTER — Ambulatory Visit
Admission: RE | Admit: 2024-02-21 | Discharge: 2024-02-21 | Disposition: A | Discharge status: Home / Self Care | Source: Ambulatory Visit | Attending: Family Medicine | Admitting: Family Medicine

## 2024-02-21 ENCOUNTER — Other Ambulatory Visit: Payer: Self-pay | Admitting: Family Medicine

## 2024-02-21 DIAGNOSIS — N6320 Unspecified lump in the left breast, unspecified quadrant: Secondary | ICD-10-CM

## 2024-02-21 DIAGNOSIS — Z09 Encounter for follow-up examination after completed treatment for conditions other than malignant neoplasm: Secondary | ICD-10-CM

## 2024-03-01 ENCOUNTER — Ambulatory Visit: Payer: Self-pay | Admitting: Surgery

## 2024-03-01 DIAGNOSIS — D241 Benign neoplasm of right breast: Secondary | ICD-10-CM

## 2024-04-09 ENCOUNTER — Other Ambulatory Visit: Payer: Self-pay | Admitting: Surgery

## 2024-04-09 DIAGNOSIS — D241 Benign neoplasm of right breast: Secondary | ICD-10-CM

## 2024-04-17 ENCOUNTER — Other Ambulatory Visit

## 2024-04-18 ENCOUNTER — Ambulatory Visit: Payer: Self-pay | Admitting: Endocrinology

## 2024-04-18 LAB — TSH: TSH: 0.9 m[IU]/L

## 2024-04-18 LAB — T4, FREE: Free T4: 1.4 ng/dL (ref 0.8–1.8)

## 2024-06-13 ENCOUNTER — Encounter (HOSPITAL_BASED_OUTPATIENT_CLINIC_OR_DEPARTMENT_OTHER): Payer: Self-pay | Admitting: Surgery

## 2024-06-13 ENCOUNTER — Other Ambulatory Visit: Payer: Self-pay

## 2024-06-18 ENCOUNTER — Ambulatory Visit: Payer: Self-pay | Admitting: Surgery

## 2024-06-18 DIAGNOSIS — N6341 Unspecified lump in right breast, subareolar: Secondary | ICD-10-CM

## 2024-06-19 ENCOUNTER — Other Ambulatory Visit: Payer: Self-pay | Admitting: Surgery

## 2024-06-19 ENCOUNTER — Ambulatory Visit
Admission: RE | Admit: 2024-06-19 | Discharge: 2024-06-19 | Disposition: A | Discharge status: Home / Self Care | Source: Ambulatory Visit | Attending: Surgery | Admitting: Surgery

## 2024-06-19 ENCOUNTER — Encounter

## 2024-06-19 DIAGNOSIS — D241 Benign neoplasm of right breast: Secondary | ICD-10-CM

## 2024-06-19 HISTORY — PX: BREAST BIOPSY: SHX20

## 2024-06-19 MED ORDER — CHLORHEXIDINE GLUCONATE CLOTH 2 % EX PADS: 6.0000 | MEDICATED_PAD | Freq: Once | CUTANEOUS | Status: DC

## 2024-06-19 NOTE — Anesthesia Preprocedure Evaluation (Signed)
 Anesthesia Evaluation  Patient identified by MRN, date of birth, ID band Patient awake    Reviewed: Allergy & Precautions, NPO status , Patient's Chart, lab work & pertinent test results  History of Anesthesia Complications (+) PONV and history of anesthetic complications  Airway Mallampati: II  TM Distance: >3 FB Neck ROM: Full    Dental no notable dental hx.    Pulmonary asthma    Pulmonary exam normal        Cardiovascular negative cardio ROS Normal cardiovascular exam     Neuro/Psych  Headaches    GI/Hepatic negative GI ROS, Neg liver ROS,,,  Endo/Other  Hypothyroidism    Renal/GU negative Renal ROS  negative genitourinary   Musculoskeletal negative musculoskeletal ROS (+)    Abdominal   Peds  Hematology negative hematology ROS (+)   Anesthesia Other Findings Day of surgery medications reviewed with patient.  Reproductive/Obstetrics negative OB ROS                              Anesthesia Physical Anesthesia Plan  ASA: 2  Anesthesia Plan: General   Post-op Pain Management: Tylenol  PO (pre-op)* and Toradol  IV (intra-op)*   Induction: Intravenous  PONV Risk Score and Plan: 4 or greater and Treatment may vary due to age or medical condition, Ondansetron , Dexamethasone , Midazolam , Scopolamine  patch - Pre-op, TIVA and Propofol  infusion  Airway Management Planned: LMA  Additional Equipment: None  Intra-op Plan:   Post-operative Plan: Extubation in OR  Informed Consent: I have reviewed the patients History and Physical, chart, labs and discussed the procedure including the risks, benefits and alternatives for the proposed anesthesia with the patient or authorized representative who has indicated his/her understanding and acceptance.     Dental advisory given  Plan Discussed with: CRNA  Anesthesia Plan Comments:          Anesthesia Quick Evaluation

## 2024-06-19 NOTE — Progress Notes (Signed)

## 2024-06-19 NOTE — H&P (Signed)
 History of Present Illness: Janet Allen is a 33 y.o. female who is seen today as an office consultation for evaluation of No chief complaint on file.  Patient is seen today for evaluation of right breast mass. This was core biopsy in the past and found to be a fibroadenoma. It is increased in size up to 3.4 cm from 1.2 cm. She has a history of bilateral breast fibroadenomas causing pain. This causes discomfort as well for her.  Review of Systems: A complete review of systems was obtained from the patient. I have reviewed this information and discussed as appropriate with the patient. See HPI as well for other ROS.    Medical History: No past medical history on file.  There is no problem list on file for this patient.  No past surgical history on file.   Not on File  No current outpatient medications on file prior to visit.   No current facility-administered medications on file prior to visit.   No family history on file.   Social History   Tobacco Use  Smoking Status Not on file  Smokeless Tobacco Not on file    Social History   Socioeconomic History  Marital status: Married   Social Drivers of Health   Financial Resource Strain: Low Risk (04/25/2023)  Received from [facility]  Overall Financial Resource Strain (CARDIA)  Difficulty of Paying Living Expenses: Not hard at all  Food Insecurity: No Food Insecurity (04/25/2023)  Received from [facility]  Hunger Vital Sign  Worried About Running Out of Food in the Last Year: Never true  Ran Out of Food in the Last Year: Never true  Transportation Needs: No Transportation Needs (04/25/2023)  Received from [facility] - Transportation  Lack of Transportation (Medical): No  Lack of Transportation (Non-Medical): No  Physical Activity: Insufficiently Active (04/25/2023)  Received from [facility]  Exercise Vital Sign  Days of Exercise per Week: 2 days  Minutes of Exercise per Session: 30 min   Stress: No Stress Concern Present (04/25/2023)  Received from [facility] - Occupational Stress Questionnaire  Feeling of Stress : Not at all  Social Connections: Socially Integrated (04/25/2023)  Received from [facility]  Social Network  How would you rate your social network (family, work, friends)?: Good participation with social networks  Housing Stability: Unknown (03/01/2024)  Housing Stability Vital Sign  Homeless in the Last Year: No   Objective:  There were no vitals filed for this visit.  There is no height or weight on file to calculate BMI.  Physical Exam Exam conducted with a chaperone present.  Cardiovascular:  Rate and Rhythm: Normal rate.  Pulmonary:  Effort: Pulmonary effort is normal.  Chest:  Breasts: Right: Mass and tenderness present.  Left: Normal.   Comments: Dense bilateral breast tissue. Scars noted no volume loss noted. Musculoskeletal:  Cervical back: Normal range of motion.  Skin: General: Skin is warm.  Neurological:  General: No focal deficit present.  Mental Status: She is alert.  Psychiatric:  Mood and Affect: Mood normal.     Labs, Imaging and Diagnostic Testing:  ADDENDUM REPORT: 02/27/2024 14:56  ADDENDUM: ADDENDUM REPORT 02/21/2024 Surgical consultation to consider excision of the fibroadenoma in the right breast given interval growth and associated pain. Referred by radiologist, Dr. Inocente Ast on 02/21/2024. RECOMMENDATION: Request for surgical consultation relayed to Erminio Console at [facility] on 02/21/2024 by Rock Hover RN. Patient has appointment scheduled for  03/01/2024 at 9:20 with Dr. Debby Shipper. Report prepared by Rock Hover RN on 02/24/2024.   Electronically Signed By: Inocente Ast M.D. On: 02/27/2024 14:56  Addended by Ast Inocente BROCKS, MD on 02/27/2024 4:56 PM   Study Result  Narrative & Impression  CLINICAL DATA: 33 year old female  presenting for short-term follow-up probably benign masses in the left breast. Patient had a right breast biopsy in April 2024 demonstrating benign fibroadenoma. Patient reports the biopsied mass in the right breast is painful.  EXAM: DIGITAL DIAGNOSTIC BILATERAL MAMMOGRAM WITH TOMOSYNTHESIS AND CAD; ULTRASOUND LEFT BREAST LIMITED; ULTRASOUND RIGHT BREAST LIMITED  TECHNIQUE: Bilateral digital diagnostic mammography and breast tomosynthesis was performed. The images were evaluated with computer-aided detection. ; Targeted ultrasound examination of the left breast was performed.; Targeted ultrasound examination of the right breast was performed  COMPARISON: Previous exam(s).  ACR Breast Density Category d: The breasts are extremely dense, which lowers the sensitivity of mammography.  FINDINGS: Mammogram:  Right breast: The biopsied mass containing a coil biopsy marking clip in the central right breast has increased in size compared to prior now measuring up to 2.7 cm.  Left breast: Stable mammographic appearance of the left breast. There are no new suspicious findings in the left breast.  Ultrasound:  Targeted ultrasound performed in the right breast at 9 o'clock 2 cm from the nipple demonstrating an oval circumscribed hypoechoic mass measuring 2.4 x 1.5 x 3.2 cm, previously measuring 1.3 x 1.0 x 1.2 cm.  Targeted ultrasound of the left breast at 2 o'clock 2 cm from the nipple demonstrates an oval circumscribed hypoechoic mass measuring 1.5 x 0.6 x 0.9 cm, previously measuring 1.4 x 0.5 x 0.9 cm. At 4 o'clock retroareolar there is a small hypoechoic mass measuring 0.6 x 0.5 x 0.4 cm, previously measuring 0.6 x 0.4 x 0.5 cm.  IMPRESSION: 1. Interval increase in size of the biopsy-proven fibroadenoma in the right breast at 9 o'clock now measuring 3.2 cm, previously 1.3 cm. 2. Stable probably benign masses in the left breast at 2 o'clock and 4  o'clock.  RECOMMENDATION: 1. Surgical consultation to consider excision of the fibroadenoma in the right breast given interval growth and associated pain.  2. Diagnostic bilateral mammogram and left breast ultrasound in 1 year.  I have discussed the findings and recommendations with the patient. If applicable, a reminder letter will be sent to the patient regarding the next appointment.  BI-RADS CATEGORY 3: Probably benign.  Electronically Signed: By: Inocente Ast M.D. On: 02/21/2024 09:52  Diagnosis Breast, right, needle core biopsy, 9 o'clock, 2 cmfn, coil clip FIBROADENOMA WITH USUAL DUCTAL HYPERPLASIA (UDH). Pepper MD Lu Pathologist, Electronic Signature (Case signed 02/16/2023) Assessment and Plan:   Diagnoses and all orders for this visit:  Fibroadenoma of breast, right  Recommend right breast seed lumpectomy due to increasing size of fibroadenoma. Reviewed the pros and cons of surgery as well as rationale for surgery. Risk of phylloides reviewed Cosmesis reviewed today as well. The procedure has been discussed with the patient. Alternatives to surgery have been discussed with the patient. Risks of surgery include bleeding, Infection, Seroma formation, death, and the need for further surgery. The patient understands and wishes to proceed.    DEBBY CURTISTINE SHIPPER, MD

## 2024-06-20 ENCOUNTER — Other Ambulatory Visit: Payer: Self-pay

## 2024-06-20 ENCOUNTER — Inpatient Hospital Stay
Admission: RE | Admit: 2024-06-20 | Discharge: 2024-06-20 | Discharge status: Home / Self Care | Source: Ambulatory Visit | Attending: Surgery | Admitting: Surgery

## 2024-06-20 ENCOUNTER — Ambulatory Visit (HOSPITAL_BASED_OUTPATIENT_CLINIC_OR_DEPARTMENT_OTHER): Payer: Self-pay | Admitting: Anesthesiology

## 2024-06-20 ENCOUNTER — Ambulatory Visit (HOSPITAL_BASED_OUTPATIENT_CLINIC_OR_DEPARTMENT_OTHER)
Admission: RE | Admit: 2024-06-20 | Discharge: 2024-06-20 | Disposition: A | Discharge status: Home / Self Care | Attending: Surgery | Admitting: Surgery

## 2024-06-20 ENCOUNTER — Encounter (HOSPITAL_BASED_OUTPATIENT_CLINIC_OR_DEPARTMENT_OTHER): Payer: Self-pay | Admitting: Surgery

## 2024-06-20 ENCOUNTER — Encounter (HOSPITAL_BASED_OUTPATIENT_CLINIC_OR_DEPARTMENT_OTHER)
Admission: RE | Disposition: A | Discharge status: Home / Self Care | Payer: Self-pay | Source: Home / Self Care | Attending: Surgery

## 2024-06-20 DIAGNOSIS — Z01818 Encounter for other preprocedural examination: Secondary | ICD-10-CM

## 2024-06-20 DIAGNOSIS — D241 Benign neoplasm of right breast: Secondary | ICD-10-CM

## 2024-06-20 DIAGNOSIS — J45909 Unspecified asthma, uncomplicated: Secondary | ICD-10-CM | POA: Diagnosis not present

## 2024-06-20 DIAGNOSIS — N6310 Unspecified lump in the right breast, unspecified quadrant: Secondary | ICD-10-CM

## 2024-06-20 DIAGNOSIS — N644 Mastodynia: Secondary | ICD-10-CM | POA: Insufficient documentation

## 2024-06-20 HISTORY — PX: BREAST LUMPECTOMY WITH RADIOACTIVE SEED LOCALIZATION: SHX6424

## 2024-06-20 HISTORY — DX: Hypothyroidism, unspecified: E03.9

## 2024-06-20 LAB — POCT PREGNANCY, URINE: Preg Test, Ur: NEGATIVE

## 2024-06-20 SURGERY — BREAST LUMPECTOMY WITH RADIOACTIVE SEED LOCALIZATION
Anesthesia: General | Site: Breast | Laterality: Right

## 2024-06-20 MED ORDER — GABAPENTIN 300 MG PO CAPS
300.0000 mg | ORAL_CAPSULE | ORAL | Status: AC
  Administered 2024-06-20: 300 mg via ORAL

## 2024-06-20 MED ORDER — BUPIVACAINE-EPINEPHRINE (PF) 0.25% -1:200000 IJ SOLN
INTRAMUSCULAR | Status: AC
  Filled 2024-06-20: qty 30

## 2024-06-20 MED ORDER — FENTANYL CITRATE (PF) 100 MCG/2ML IJ SOLN: 25.0000 ug | INTRAMUSCULAR | Status: DC | PRN

## 2024-06-20 MED ORDER — MIDAZOLAM HCL 5 MG/5ML IJ SOLN
INTRAMUSCULAR | Status: DC | PRN
  Administered 2024-06-20: 2 mg via INTRAVENOUS

## 2024-06-20 MED ORDER — DROPERIDOL 2.5 MG/ML IJ SOLN: 0.6250 mg | Freq: Once | INTRAMUSCULAR | Status: DC | PRN

## 2024-06-20 MED ORDER — OXYCODONE HCL 5 MG/5ML PO SOLN: 5.0000 mg | Freq: Once | ORAL | Status: DC | PRN

## 2024-06-20 MED ORDER — PROPOFOL 10 MG/ML IV BOLUS
INTRAVENOUS | Status: AC
  Filled 2024-06-20: qty 20

## 2024-06-20 MED ORDER — DEXAMETHASONE SODIUM PHOSPHATE 10 MG/ML IJ SOLN
INTRAMUSCULAR | Status: AC
  Filled 2024-06-20: qty 1

## 2024-06-20 MED ORDER — ACETAMINOPHEN 500 MG PO TABS
1000.0000 mg | ORAL_TABLET | ORAL | Status: AC
  Administered 2024-06-20: 1000 mg via ORAL

## 2024-06-20 MED ORDER — OXYCODONE HCL 5 MG PO TABS: 5.0000 mg | ORAL_TABLET | Freq: Four times a day (QID) | ORAL | 0 refills | Status: AC | PRN

## 2024-06-20 MED ORDER — LACTATED RINGERS IV SOLN: INTRAVENOUS | Status: DC

## 2024-06-20 MED ORDER — ACETAMINOPHEN 500 MG PO TABS: 1000.0000 mg | ORAL_TABLET | Freq: Once | ORAL | Status: AC

## 2024-06-20 MED ORDER — ONDANSETRON HCL 4 MG/2ML IJ SOLN
INTRAMUSCULAR | Status: AC
  Filled 2024-06-20: qty 2

## 2024-06-20 MED ORDER — ONDANSETRON HCL 4 MG/2ML IJ SOLN
INTRAMUSCULAR | Status: DC | PRN
  Administered 2024-06-20: 4 mg via INTRAVENOUS

## 2024-06-20 MED ORDER — PROPOFOL 10 MG/ML IV BOLUS
INTRAVENOUS | Status: DC | PRN
  Administered 2024-06-20 (×4): 50 mg via INTRAVENOUS
  Administered 2024-06-20: 150 mg via INTRAVENOUS
  Administered 2024-06-20: 50 mg via INTRAVENOUS

## 2024-06-20 MED ORDER — PROPOFOL 500 MG/50ML IV EMUL
INTRAVENOUS | Status: DC | PRN
  Administered 2024-06-20: 150 ug/kg/min via INTRAVENOUS

## 2024-06-20 MED ORDER — ACETAMINOPHEN 500 MG PO TABS
ORAL_TABLET | ORAL | Status: AC
  Filled 2024-06-20: qty 2

## 2024-06-20 MED ORDER — CEFAZOLIN SODIUM-DEXTROSE 2-4 GM/100ML-% IV SOLN
INTRAVENOUS | Status: AC
  Filled 2024-06-20: qty 100

## 2024-06-20 MED ORDER — FENTANYL CITRATE (PF) 100 MCG/2ML IJ SOLN
INTRAMUSCULAR | Status: DC | PRN
  Administered 2024-06-20 (×2): 50 ug via INTRAVENOUS

## 2024-06-20 MED ORDER — MIDAZOLAM HCL 2 MG/2ML IJ SOLN
INTRAMUSCULAR | Status: AC
  Filled 2024-06-20: qty 2

## 2024-06-20 MED ORDER — 0.9 % SODIUM CHLORIDE (POUR BTL) OPTIME
TOPICAL | Status: DC | PRN
  Administered 2024-06-20: 500 mL

## 2024-06-20 MED ORDER — KETOROLAC TROMETHAMINE 30 MG/ML IJ SOLN
INTRAMUSCULAR | Status: AC
  Filled 2024-06-20: qty 1

## 2024-06-20 MED ORDER — PROPOFOL 500 MG/50ML IV EMUL
INTRAVENOUS | Status: AC
  Filled 2024-06-20: qty 50

## 2024-06-20 MED ORDER — IBUPROFEN 800 MG PO TABS: 800.0000 mg | ORAL_TABLET | Freq: Three times a day (TID) | ORAL | 0 refills | Status: AC | PRN

## 2024-06-20 MED ORDER — CEFAZOLIN SODIUM-DEXTROSE 2-4 GM/100ML-% IV SOLN
2.0000 g | INTRAVENOUS | Status: AC
  Administered 2024-06-20: 2 g via INTRAVENOUS

## 2024-06-20 MED ORDER — FENTANYL CITRATE (PF) 100 MCG/2ML IJ SOLN
INTRAMUSCULAR | Status: AC
  Filled 2024-06-20: qty 2

## 2024-06-20 MED ORDER — GABAPENTIN 300 MG PO CAPS
ORAL_CAPSULE | ORAL | Status: AC
  Filled 2024-06-20: qty 1

## 2024-06-20 MED ORDER — BUPIVACAINE-EPINEPHRINE (PF) 0.25% -1:200000 IJ SOLN
INTRAMUSCULAR | Status: DC | PRN
  Administered 2024-06-20: 20 mL

## 2024-06-20 MED ORDER — SCOPOLAMINE 1 MG/3DAYS TD PT72
1.0000 | MEDICATED_PATCH | Freq: Once | TRANSDERMAL | Status: DC
  Administered 2024-06-20: 1.5 mg via TRANSDERMAL

## 2024-06-20 MED ORDER — KETOROLAC TROMETHAMINE 30 MG/ML IJ SOLN
INTRAMUSCULAR | Status: DC | PRN
  Administered 2024-06-20: 30 mg via INTRAVENOUS

## 2024-06-20 MED ORDER — LIDOCAINE 2% (20 MG/ML) 5 ML SYRINGE
INTRAMUSCULAR | Status: AC
  Filled 2024-06-20: qty 5

## 2024-06-20 MED ORDER — OXYCODONE HCL 5 MG PO TABS: 5.0000 mg | ORAL_TABLET | Freq: Once | ORAL | Status: DC | PRN

## 2024-06-20 MED ORDER — DEXAMETHASONE SODIUM PHOSPHATE 4 MG/ML IJ SOLN
INTRAMUSCULAR | Status: DC | PRN
  Administered 2024-06-20: 5 mg via INTRAVENOUS

## 2024-06-20 MED ORDER — LIDOCAINE HCL (CARDIAC) PF 100 MG/5ML IV SOSY
PREFILLED_SYRINGE | INTRAVENOUS | Status: DC | PRN
  Administered 2024-06-20: 100 mg via INTRAVENOUS

## 2024-06-20 MED ORDER — SCOPOLAMINE 1 MG/3DAYS TD PT72
MEDICATED_PATCH | TRANSDERMAL | Status: AC
  Filled 2024-06-20: qty 1

## 2024-06-20 SURGICAL SUPPLY — 41 items
BINDER BREAST LRG (GAUZE/BANDAGES/DRESSINGS) IMPLANT
BINDER BREAST MEDIUM (GAUZE/BANDAGES/DRESSINGS) IMPLANT
BINDER BREAST XLRG (GAUZE/BANDAGES/DRESSINGS) IMPLANT
BINDER BREAST XXLRG (GAUZE/BANDAGES/DRESSINGS) IMPLANT
BLADE SURG 15 STRL LF DISP TIS (BLADE) ×2 IMPLANT
CANISTER SUC SOCK COL 7IN (MISCELLANEOUS) IMPLANT
CANISTER SUCT 1200ML W/VALVE (MISCELLANEOUS) IMPLANT
CHLORAPREP W/TINT 26 (MISCELLANEOUS) ×2 IMPLANT
CLIP APPLIE 9.375 MED OPEN (MISCELLANEOUS) IMPLANT
COVER BACK TABLE 60X90IN (DRAPES) ×2 IMPLANT
COVER MAYO STAND STRL (DRAPES) ×2 IMPLANT
COVER PROBE CYLINDRICAL 5X96 (MISCELLANEOUS) ×2 IMPLANT
DERMABOND ADVANCED .7 DNX12 (GAUZE/BANDAGES/DRESSINGS) ×2 IMPLANT
DRAPE LAPAROSCOPIC ABDOMINAL (DRAPES) IMPLANT
DRAPE LAPAROTOMY 100X72 PEDS (DRAPES) ×2 IMPLANT
DRAPE UTILITY XL STRL (DRAPES) ×2 IMPLANT
ELECT COATED BLADE 2.86 ST (ELECTRODE) ×2 IMPLANT
ELECTRODE REM PT RTRN 9FT ADLT (ELECTROSURGICAL) ×2 IMPLANT
GLOVE BIOGEL PI IND STRL 8 (GLOVE) ×2 IMPLANT
GLOVE ECLIPSE 8.0 STRL XLNG CF (GLOVE) ×2 IMPLANT
GOWN STRL REUS W/ TWL LRG LVL3 (GOWN DISPOSABLE) ×4 IMPLANT
GOWN STRL REUS W/ TWL XL LVL3 (GOWN DISPOSABLE) ×2 IMPLANT
HEMOSTAT ARISTA ABSORB 3G PWDR (HEMOSTASIS) IMPLANT
HEMOSTAT SNOW SURGICEL 2X4 (HEMOSTASIS) IMPLANT
KIT MARKER MARGIN INK (KITS) ×2 IMPLANT
NDL HYPO 25X1 1.5 SAFETY (NEEDLE) ×2 IMPLANT
NEEDLE HYPO 25X1 1.5 SAFETY (NEEDLE) ×1 IMPLANT
NS IRRIG 1000ML POUR BTL (IV SOLUTION) ×2 IMPLANT
PACK BASIN DAY SURGERY FS (CUSTOM PROCEDURE TRAY) ×2 IMPLANT
PENCIL SMOKE EVACUATOR (MISCELLANEOUS) ×2 IMPLANT
SLEEVE SCD COMPRESS KNEE MED (STOCKING) ×2 IMPLANT
SPIKE FLUID TRANSFER (MISCELLANEOUS) IMPLANT
SPONGE T-LAP 4X18 ~~LOC~~+RFID (SPONGE) ×2 IMPLANT
SUT MNCRL AB 4-0 PS2 18 (SUTURE) ×2 IMPLANT
SUT SILK 2 0 SH (SUTURE) IMPLANT
SUT VICRYL 3-0 CR8 SH (SUTURE) ×2 IMPLANT
SYR CONTROL 10ML LL (SYRINGE) ×2 IMPLANT
TOWEL GREEN STERILE FF (TOWEL DISPOSABLE) ×2 IMPLANT
TRAY FAXITRON CT DISP (TRAY / TRAY PROCEDURE) ×2 IMPLANT
TUBE CONNECTING 20X1/4 (TUBING) IMPLANT
YANKAUER SUCT BULB TIP NO VENT (SUCTIONS) IMPLANT

## 2024-06-20 NOTE — Discharge Instructions (Addendum)
 [facility] Office Phone Number [phone]  BREAST BIOPSY/ PARTIAL MASTECTOMY: POST OP INSTRUCTIONS  Always review your discharge instruction sheet given to you by the facility where your surgery was performed.  IF YOU HAVE DISABILITY OR FAMILY LEAVE FORMS, YOU MUST BRING THEM TO THE OFFICE FOR PROCESSING.  DO NOT GIVE THEM TO YOUR DOCTOR.  A prescription for pain medication may be given to you upon discharge.  Take your pain medication as prescribed, if needed.  If narcotic pain medicine is not needed, then you may take acetaminophen  (Tylenol ) or ibuprofen  (Advil ) as needed. Take your usually prescribed medications unless otherwise directed If you need a refill on your pain medication, please contact your pharmacy.  They will contact our office to request authorization.  Prescriptions will not be filled after 5pm or on week-ends. You should eat very light the first 24 hours after surgery, such as soup, crackers, pudding, etc.  Resume your normal diet the day after surgery. Most patients will experience some swelling and bruising in the breast.  Ice packs and a good support bra will help.  Swelling and bruising can take several days to resolve.  It is common to experience some constipation if taking pain medication after surgery.  Increasing fluid intake and taking a stool softener will usually help or prevent this problem from occurring.  A mild laxative (Milk of Magnesia or Miralax) should be taken according to package directions if there are no bowel movements after 48 hours. Unless discharge instructions indicate otherwise, you may remove your bandages 24-48 hours after surgery, and you may shower at that time.  You may have steri-strips (small skin tapes) in place directly over the incision.  These strips should be left on the skin for 7-10 days.  If your surgeon used skin glue on the incision, you may shower in 24 hours.  The glue will flake off over the next 2-3 weeks.  Any  sutures or staples will be removed at the office during your follow-up visit. ACTIVITIES:  You may resume regular daily activities (gradually increasing) beginning the next day.  Wearing a good support bra or sports bra minimizes pain and swelling.  You may have sexual intercourse when it is comfortable. You may drive when you no longer are taking prescription pain medication, you can comfortably wear a seatbelt, and you can safely maneuver your car and apply brakes. RETURN TO WORK:  ______________________________________________________________________________________ Rosine should see your doctor in the office for a follow-up appointment approximately two weeks after your surgery.  Your doctor's nurse will typically make your follow-up appointment when she calls you with your pathology report.  Expect your pathology report 2-3 business days after your surgery.  You may call to check if you do not hear from us  after three days. OTHER INSTRUCTIONS: _______________________________________________________________________________________________ _____________________________________________________________________________________________________________________________________ _____________________________________________________________________________________________________________________________________ _____________________________________________________________________________________________________________________________________  WHEN TO CALL YOUR DOCTOR: Fever over 101.0 Nausea and/or vomiting. Extreme swelling or bruising. Continued bleeding from incision. Increased pain, redness, or drainage from the incision.  The clinic staff is available to answer your questions during regular business hours.  Please don't hesitate to call and ask to speak to one of the nurses for clinical concerns.  If you have a medical emergency, go to the nearest emergency room or call 911.  A surgeon from [facility] is always on call at the hospital.  For further questions, please visit [email or web address]   Post Anesthesia Home Care Instructions  Activity: Get plenty of rest for the remainder of the  day. A responsible individual must stay with you for 24 hours following the procedure.  For the next 24 hours, DO NOT: -Drive a car -Advertising copywriter -Drink alcoholic beverages -Take any medication unless instructed by your physician -Make any legal decisions or sign important papers.  Meals: Start with liquid foods such as gelatin or soup. Progress to regular foods as tolerated. Avoid greasy, spicy, heavy foods. If nausea and/or vomiting occur, drink only clear liquids until the nausea and/or vomiting subsides. Call your physician if vomiting continues.  Special Instructions/Symptoms: Your throat may feel dry or sore from the anesthesia or the breathing tube placed in your throat during surgery. If this causes discomfort, gargle with warm salt water. The discomfort should disappear within 24 hours.  If you had a scopolamine  patch placed behind your ear for the management of post- operative nausea and/or vomiting:  1. The medication in the patch is effective for 72 hours, after which it should be removed.  Wrap patch in a tissue and discard in the trash. Wash hands thoroughly with soap and water. 2. You may remove the patch earlier than 72 hours if you experience unpleasant side effects which may include dry mouth, dizziness or visual disturbances. 3. Avoid touching the patch. Wash your hands with soap and water after contact with the patch.       Next dose of tylenol  maybe taken at 1p if needed

## 2024-06-20 NOTE — Op Note (Signed)
 Preoperative diagnosis: Right breast mass consistent with fibroadenoma increasing in size upper outer quadrant  Postoperative diagnosis: Same  Procedure: Right breast seed localized lumpectomy  Surgeon Debby Shipper, MD  Anesthesia: LMA with 0.25% Marcaine  with epinephrine   EBL: Minimal  Specimen: Right breast mass with seed and clip verified by Faxitron  Drains: None  Indications for procedure: The patient is 2 female with expanding right breast mass.  This was biopsied in 2024 and found to be a 5.  Without atypia.  It doubled in size since then.  She presents for lumpectomy due to the increasing size of right breast mass.The procedure has been discussed with the patient. Alternatives to surgery have been discussed with the patient.  Risks of surgery include bleeding,  Infection,  Seroma formation, death,  and the need for further surgery.   The patient understands and wishes to proceed.    Description of procedure: The patient was met in the holding area and questions were answered.  The right breast was marked as the correct site and a seed was placed by radiology as an outpatient.  Those films were available for review.  She was taken back to the operating room placed supine upon the operating table.  After induction of general anesthesia, the right breast was prepped and draped in a sterile fashion timeout was performed.  Neoprobe was used to identify the seed in the right upper outer quadrant adjacent to the nipple areolar complex.  There is a previous incision in the right upper quadrant with previous lumpectomy that I used.  This was a transverse based incision.  Local anesthetic was infiltrated the previous scar and this transverse incision was used again.  Dissection was carried down the mass was identified.  She had dense fibrous tissue consistent for her age.  The entire mass was excised with what appeared to be grossly negative margins.  The deep margin was the pectoralis muscle.   Imaging showed the clip and seed to be present and the tissue was oriented with ink.  The cavity was irrigated.  He was made hemostatic with cautery.  Local anesthetic infiltrated and hemostasis achieved.  The deep tissue planes were approximated with 3-0 Vicryl.  4-0 Monocryl was used to close the skin in a subcuticular fashion.  Dermabond was applied.  Breast binder placed the patient was awoken extubated taken to recovery in satisfactory condition.  All counts were correct

## 2024-06-20 NOTE — Anesthesia Procedure Notes (Signed)
 Procedure Name: LMA Insertion Date/Time: 06/20/2024 8:33 AM  Performed by: Pam Macario BROCKS, CRNAPre-anesthesia Checklist: Patient identified, Emergency Drugs available, Suction available, Patient being monitored and Timeout performed Patient Re-evaluated:Patient Re-evaluated prior to induction Oxygen Delivery Method: Circle system utilized Preoxygenation: Pre-oxygenation with 100% oxygen Induction Type: IV induction Ventilation: Mask ventilation without difficulty LMA: LMA inserted LMA Size: 4.0 Number of attempts: 1 Airway Equipment and Method: Bite block Placement Confirmation: positive ETCO2, breath sounds checked- equal and bilateral and CO2 detector Tube secured with: Tape Dental Injury: Teeth and Oropharynx as per pre-operative assessment

## 2024-06-20 NOTE — Transfer of Care (Signed)
 Immediate Anesthesia Transfer of Care Note  Patient: Janet Allen  Procedure(s) Performed: BREAST LUMPECTOMY WITH RADIOACTIVE SEED LOCALIZATION (Right: Breast)  Patient Location: PACU  Anesthesia Type:General  Level of Consciousness: awake  Airway & Oxygen Therapy: Patient Spontanous Breathing  Post-op Assessment: Report given to RN  Post vital signs: Reviewed  Last Vitals:  Vitals Value Taken Time  BP 123/67 06/20/24 09:37  Temp    Pulse 85 06/20/24 09:41  Resp 10 06/20/24 09:41  SpO2 100 % 06/20/24 09:41  Vitals shown include unfiled device data.  Last Pain:  Vitals:   06/20/24 0658  TempSrc: Temporal  PainSc: 4          Complications: No notable events documented.

## 2024-06-20 NOTE — Anesthesia Postprocedure Evaluation (Signed)
 Anesthesia Post Note  Patient: Janet Allen  Procedure(s) Performed: BREAST LUMPECTOMY WITH RADIOACTIVE SEED LOCALIZATION (Right: Breast)     Patient location during evaluation: PACU Anesthesia Type: General Level of consciousness: awake and alert Pain management: pain level controlled Vital Signs Assessment: post-procedure vital signs reviewed and stable Respiratory status: spontaneous breathing, nonlabored ventilation and respiratory function stable Cardiovascular status: blood pressure returned to baseline Postop Assessment: no apparent nausea or vomiting Anesthetic complications: no   No notable events documented.  Last Vitals:  Vitals:   06/20/24 1000 06/20/24 1011  BP: 118/70 116/76  Pulse: 66 60  Resp: 11 16  Temp:  36.6 C  SpO2: 99% 97%    Last Pain:  Vitals:   06/20/24 1011  TempSrc: Temporal  PainSc: 0-No pain                 Vertell Row

## 2024-06-20 NOTE — Interval H&P Note (Signed)
 History and Physical Interval Note:  06/20/2024 8:07 AM  Janet Allen  has presented today for surgery, with the diagnosis of RIGHT BREAST FIBROADENOMA.  The various methods of treatment have been discussed with the patient and family. After consideration of risks, benefits and other options for treatment, the patient has consented to  Procedure(s) with comments: BREAST LUMPECTOMY WITH RADIOACTIVE SEED LOCALIZATION (Right) - RIGHT BREAST SEED LUMPECTOMY as a surgical intervention.  The patient's history has been reviewed, patient examined, no change in status, stable for surgery.  I have reviewed the patient's chart and labs.  Questions were answered to the patient's satisfaction.   The procedure has been discussed with the patient. Alternatives to surgery have been discussed with the patient.  Risks of surgery include bleeding,  Infection,  Seroma formation, death,  and the need for further surgery.   The patient understands and wishes to proceed.   Lashundra Shiveley A Arron Mcnaught

## 2024-06-21 ENCOUNTER — Encounter (HOSPITAL_BASED_OUTPATIENT_CLINIC_OR_DEPARTMENT_OTHER): Payer: Self-pay | Admitting: Surgery

## 2024-06-22 LAB — SURGICAL PATHOLOGY

## 2024-06-25 ENCOUNTER — Ambulatory Visit: Payer: Self-pay | Admitting: Surgery

## 2024-11-13 ENCOUNTER — Other Ambulatory Visit: Payer: Managed Care, Other (non HMO)

## 2024-11-13 ENCOUNTER — Other Ambulatory Visit: Payer: Self-pay

## 2024-11-13 LAB — T4, FREE: Free T4: 1.2 ng/dL (ref 0.8–1.8)

## 2024-11-13 LAB — TSH: TSH: 0.99 m[IU]/L

## 2024-11-14 ENCOUNTER — Ambulatory Visit: Payer: Self-pay | Admitting: Endocrinology

## 2024-11-20 ENCOUNTER — Encounter: Payer: Self-pay | Admitting: Endocrinology

## 2024-11-20 ENCOUNTER — Ambulatory Visit (INDEPENDENT_AMBULATORY_CARE_PROVIDER_SITE_OTHER): Payer: Managed Care, Other (non HMO) | Admitting: Endocrinology

## 2024-11-20 VITALS — BP 118/82 | HR 75 | Resp 16 | Ht 65.0 in | Wt 160.0 lb

## 2024-11-20 DIAGNOSIS — E063 Autoimmune thyroiditis: Secondary | ICD-10-CM

## 2024-11-20 DIAGNOSIS — E038 Other specified hypothyroidism: Secondary | ICD-10-CM

## 2024-11-20 MED ORDER — LEVOTHYROXINE SODIUM 50 MCG PO TABS: 50.0000 ug | ORAL_TABLET | Freq: Every day | ORAL | 4 refills | Status: AC

## 2024-11-20 NOTE — Progress Notes (Signed)
 "  Outpatient Endocrinology Note Kathrine Rieves, MD   Patient's Name: Janet Allen    DOB: 03-Jan-1991    MRN: [id number]  REASON OF VISIT: Follow-up for hypothyroidism  PCP: Verena Mems, MD  HISTORY OF PRESENT ILLNESS:   Janet Allen is a 34 y.o. old female with past medical history as listed below is presented for a follow up for hypothyroidism.   Interval history Patient has been taking levothyroxine  50 mcg daily, dose was increased in summer 2025 due to fatigue, she felt symptomatically better and thyroid  function test remained normal.  No palpitation and heat intolerance.  She felt better in terms of energy level.  Still with occasional cold intolerance otherwise no complaints today.  Recent thyroid  function test normal as follows.   Latest Reference Range & Units 11/13/24 09:11  TSH mIU/L 0.99  T4,Free(Direct) 0.8 - 1.8 ng/dL 1.2   Pertinent Thyroid  History: Patient was previously seen by Dr. Von and was last time seen in January 2024. Patient was diagnosed with hypothyroidism in January 2018.  She had symptoms of fatigue and cold intolerance at the time of diagnosis.  TSH was 24 at the time of diagnosis.  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  She was started on levothyroxine  initial dose was 50 mcg daily.  On her own she had not taken any levothyroxine  for a few months prior to her,visit in 8/23. She did not feel any different for some time but right before her last visit she was having  some fatigue, some hair loss and cold intolerance. Although her thyroid  levels were still normal she has been tried on empirical doses of 25 mcg levothyroxine  for the last 4 months. She thinks that about 3 weeks after medication started she started feeling better with her energy level . Has lost weight, may be slightly better with her hair loss.  Levothyroxine  was increased from 25 to 50 mg daily due to complaints of fatigue and she has remained euthyroid after  that.  She has symptomatic improvement.   REVIEW OF SYSTEMS:  As per history of present illness.   PAST MEDICAL HISTORY: Past Medical History:  Diagnosis Date   Allergy    Asthma    Headache    Heart murmur    History of recurrent UTIs    Hyperthyroidism    Hypothyroid    Pneumonia    hx   PONV (postoperative nausea and vomiting)    Tinnitus    Vertigo    Vitamin D deficiency     PAST SURGICAL HISTORY: Past Surgical History:  Procedure Laterality Date   BREAST BIOPSY Left 09/20/2013   Procedure: BREAST mass WITH NEEDLE LOCALIZATION;  Surgeon: Jina Nephew, MD;  Location: [facility];  Service: General;  Laterality: Left;  needle loc BCG 7:30   BREAST BIOPSY Right 09/20/2013   Procedure: BREAST mass;  Surgeon: Jina Nephew, MD;  Location: [facility] OR;  Service: General;  Laterality: Right;   BREAST BIOPSY Right 02/15/2023   US  RT BREAST BX W LOC DEV 1ST LESION IMG BX SPEC US  GUIDE 02/15/2023 [facility]   BREAST BIOPSY  06/19/2024   US  RT RADIOACTIVE SEED LOC 06/19/2024 [facility]   BREAST LUMPECTOMY WITH RADIOACTIVE SEED LOCALIZATION Right 06/20/2024   Procedure: BREAST LUMPECTOMY WITH RADIOACTIVE SEED LOCALIZATION;  Surgeon: Vanderbilt Ned, MD;  Location: [facility];  Service: General;  Laterality: Right;  RIGHT BREAST SEED LUMPECTOMY   HERNIA REPAIR  1990   MASS EXCISION Left  03/15/2013   Procedure: EXCISION OF TWO LEFT BREAST MASSES;  Surgeon: Jina Nephew, MD;  Location: [facility] OR;  Service: General;  Laterality: Left;   TYMPANOSTOMY TUBE PLACEMENT  1991   WISDOM TOOTH EXTRACTION  2004    ALLERGIES: No Known Allergies  FAMILY HISTORY:  Family History  Problem Relation Age of Onset   Hypertension Mother    Depression Mother    Anxiety disorder Sister    Breast cancer Maternal Grandmother    Fibrocystic breast disease Maternal Grandmother    Hypertension Paternal Grandmother    Cancer Paternal Grandmother        stomach   Diabetes Paternal  Grandfather    Thyroid  disease Neg Hx     SOCIAL HISTORY: Social History   Socioeconomic History   Marital status: Married    Spouse name: Not on file   Number of children: Not on file   Years of education: Not on file   Highest education level: Not on file  Occupational History   Not on file  Tobacco Use   Smoking status: Never   Smokeless tobacco: Never  Substance and Sexual Activity   Alcohol use: No    Alcohol/week: 0.0 standard drinks of alcohol   Drug use: No   Sexual activity: Yes    Partners: Male    Birth control/protection: Condom  Other Topics Concern   Not on file  Social History Narrative   Not on file   Social Drivers of Health   Tobacco Use: Low Risk (11/20/2024)   Patient History    Smoking Tobacco Use: Never    Smokeless Tobacco Use: Never    Passive Exposure: Not on file  Financial Resource Strain: Low Risk (04/25/2023)   Received from [facility]   Overall Financial Resource Strain (CARDIA)    Difficulty of Paying Living Expenses: Not hard at all  Food Insecurity: No Food Insecurity (04/25/2023)   Received from [facility]   Epic    Within the past 12 months, you worried that your food would run out before you got the money to buy more.: Never true    Within the past 12 months, the food you bought just didn't last and you didn't have money to get more.: Never true  Transportation Needs: No Transportation Needs (04/25/2023)   Received from [facility] - Transportation    Lack of Transportation (Medical): No    Lack of Transportation (Non-Medical): No  Physical Activity: Insufficiently Active (04/25/2023)   Received from [facility]   Exercise Vital Sign    On average, how many days per week do you engage in moderate to strenuous exercise (like a brisk walk)?: 2 days    On average, how many minutes do you engage in exercise at this level?: 30 min  Stress: No Stress Concern Present (04/25/2023)   Received from [facility] - Occupational Stress Questionnaire    Feeling of Stress : Not at all  Social Connections: Socially Integrated (04/25/2023)   Received from [facility]   Social Network    How would you rate your social network (family, work, friends)?: Good participation with social networks  Depression (PHQ2-9): Not on file  Alcohol Screen: Not on file  Housing: Unknown (03/01/2024)   Received from [facility]   Epic    Unable to Pay for Housing in the Last Year: Not on file    Number of Times Moved in the  Last Year: Not on file    At any time in the past 12 months, were you homeless or living in a shelter (including now)?: No  Utilities: Not At Risk (04/25/2023)   Received from [facility] Utilities    Threatened with loss of utilities: No  Health Literacy: Not on file    MEDICATIONS:  Current Outpatient Medications  Medication Sig Dispense Refill   albuterol (PROVENTIL HFA;VENTOLIN HFA) 108 (90 BASE) MCG/ACT inhaler Inhale 2 puffs into the lungs daily as needed for wheezing or shortness of breath.     cholecalciferol (VITAMIN D3) 25 MCG (1000 UNIT) tablet Take 1,000 Units by mouth daily.     desloratadine (CLARINEX) 5 MG tablet Take 5 mg by mouth daily.     ferrous sulfate 324 MG TBEC Take 324 mg by mouth.     Fluticasone-Umeclidin-Vilant (TRELEGY ELLIPTA) 200-62.5-25 MCG/ACT AEPB INHALE 1 PUFF BY MOUTH EVERY DAY     ibuprofen  (ADVIL ) 800 MG tablet Take 1 tablet (800 mg total) by mouth every 8 (eight) hours as needed. 30 tablet 0   loratadine (CLARITIN) 10 MG tablet 1 tablet     montelukast (SINGULAIR) 10 MG tablet Take 10 mg by mouth at bedtime.     oxyCODONE  (OXY IR/ROXICODONE ) 5 MG immediate release tablet Take 1 tablet (5 mg total) by mouth every 6 (six) hours as needed for severe pain (pain score 7-10). 15 tablet 0   levothyroxine  (SYNTHROID ) 50 MCG tablet Take 1 tablet (50 mcg total) by mouth daily before breakfast. 90 tablet  4   No current facility-administered medications for this visit.    PHYSICAL EXAM: Vitals:   11/20/24 0830  BP: 118/82  Pulse: 75  Resp: 16  SpO2: 98%  Weight: 160 lb (72.6 kg)  Height: 5' 5 (1.651 m)    Body mass index is 26.63 kg/m.  Wt Readings from Last 3 Encounters:  11/20/24 160 lb (72.6 kg)  06/20/24 157 lb 6.5 oz (71.4 kg)  11/15/23 157 lb 9.6 oz (71.5 kg)    General: Well developed, well nourished female in no apparent distress.  HEENT: AT/, no external lesions. Hearing intact to the spoken word Eyes: Conjunctiva clear and no icterus. Neck: Trachea midline, neck supple without appreciable thyromegaly or lymphadenopathy and no palpable thyroid  nodules Abdomen: Soft, non tender, non distended Neurologic: Alert, oriented, normal speech, deep tendon biceps reflexes normal,  no gross focal neurological deficit Extremities: No pedal pitting edema, no tremors of outstretched hands Skin: Warm, color good.  Psychiatric: Does not appear depressed or anxious  PERTINENT HISTORIC LABORATORY AND IMAGING STUDIES:  All pertinent laboratory results were reviewed. Please see HPI also for further details.   TSH  Date Value Ref Range Status  11/13/2024 0.99 mIU/L Final    Comment:              Reference Range .           > or = 20 Years  0.40-4.50 .                Pregnancy Ranges           First trimester    0.26-2.66           Second trimester   0.55-2.73           Third trimester    0.43-2.91   04/17/2024 0.90 mIU/L Final    Comment:  Reference Range .           > or = 20 Years  0.40-4.50 .                Pregnancy Ranges           First trimester    0.26-2.66           Second trimester   0.55-2.73           Third trimester    0.43-2.91   11/09/2023 1.060 0.450 - 4.500 uIU/mL Final     ASSESSMENT / PLAN  1. Hypothyroidism due to Hashimoto's thyroiditis   2. Adult onset hypothyroidism     -Patient has been taking levothyroxine  50 mcg daily.   She is clinically and biochemically euthyroid, with recent thyroid  function test normal.  Plan: -Continue current dose of levothyroxine  50 mcg daily. -Annual endocrinology follow-up.   Diagnoses and all orders for this visit:  Hypothyroidism due to Hashimoto's thyroiditis  Adult onset hypothyroidism -     levothyroxine  (SYNTHROID ) 50 MCG tablet; Take 1 tablet (50 mcg total) by mouth daily before breakfast. -     T4, free -     TSH   DISPOSITION Follow up in clinic in 12 months suggested.  Labs prior to visit.  All questions answered and patient verbalized understanding of the plan.  Trang Bouse, MD [facility] Endocrinology [facility] [address] [place], [address] [place], [place] [address] Phone # [phone]  At least part of this note was generated using voice recognition software. Inadvertent word errors may have occurred, which were not recognized during the proofreading process.    "

## 2025-11-19 ENCOUNTER — Other Ambulatory Visit

## 2025-11-26 ENCOUNTER — Ambulatory Visit: Admitting: Endocrinology
